# Patient Record
Sex: Female | Born: 1975 | Race: White | Hispanic: No | Smoking: Former smoker
Health system: Southern US, Community
[De-identification: ages and names within clinical notes are randomized; demographics above are authoritative.]

## PROBLEM LIST (undated history)

## (undated) DIAGNOSIS — E611 Iron deficiency: Secondary | ICD-10-CM

## (undated) DIAGNOSIS — N159 Renal tubulo-interstitial disease, unspecified: Secondary | ICD-10-CM

## (undated) DIAGNOSIS — Z8619 Personal history of other infectious and parasitic diseases: Secondary | ICD-10-CM

## (undated) DIAGNOSIS — B379 Candidiasis, unspecified: Secondary | ICD-10-CM

## (undated) DIAGNOSIS — IMO0002 Reserved for concepts with insufficient information to code with codable children: Secondary | ICD-10-CM

## (undated) HISTORY — DX: Renal tubulo-interstitial disease, unspecified: N15.9

## (undated) HISTORY — PX: TUBAL LIGATION: SHX77

## (undated) HISTORY — DX: Iron deficiency: E61.1

## (undated) HISTORY — PX: TOOTH EXTRACTION: SUR596

## (undated) HISTORY — PX: ENDOMETRIAL ABLATION: SHX621

## (undated) HISTORY — DX: Personal history of other infectious and parasitic diseases: Z86.19

## (undated) HISTORY — DX: Candidiasis, unspecified: B37.9

## (undated) HISTORY — PX: DILATION AND CURETTAGE OF UTERUS: SHX78

## (undated) HISTORY — DX: Reserved for concepts with insufficient information to code with codable children: IMO0002

---

## 2010-07-20 DIAGNOSIS — IMO0002 Reserved for concepts with insufficient information to code with codable children: Secondary | ICD-10-CM | POA: Insufficient documentation

## 2010-07-20 DIAGNOSIS — R87619 Unspecified abnormal cytological findings in specimens from cervix uteri: Secondary | ICD-10-CM

## 2010-07-20 HISTORY — DX: Unspecified abnormal cytological findings in specimens from cervix uteri: R87.619

## 2010-07-20 HISTORY — DX: Reserved for concepts with insufficient information to code with codable children: IMO0002

## 2011-06-29 ENCOUNTER — Telehealth: Payer: Self-pay | Admitting: Obstetrics and Gynecology

## 2011-06-30 NOTE — Telephone Encounter (Signed)
Spoke with pt

## 2011-07-06 ENCOUNTER — Ambulatory Visit (INDEPENDENT_AMBULATORY_CARE_PROVIDER_SITE_OTHER): Payer: BC Managed Care – HMO | Admitting: Obstetrics and Gynecology

## 2011-07-06 ENCOUNTER — Encounter: Payer: Self-pay | Admitting: Obstetrics and Gynecology

## 2011-07-06 DIAGNOSIS — Z8619 Personal history of other infectious and parasitic diseases: Secondary | ICD-10-CM

## 2011-07-06 DIAGNOSIS — E611 Iron deficiency: Secondary | ICD-10-CM

## 2011-07-06 DIAGNOSIS — B49 Unspecified mycosis: Secondary | ICD-10-CM

## 2011-07-06 DIAGNOSIS — N926 Irregular menstruation, unspecified: Secondary | ICD-10-CM

## 2011-07-06 DIAGNOSIS — R6889 Other general symptoms and signs: Secondary | ICD-10-CM

## 2011-07-06 DIAGNOSIS — B379 Candidiasis, unspecified: Secondary | ICD-10-CM

## 2011-07-06 DIAGNOSIS — D509 Iron deficiency anemia, unspecified: Secondary | ICD-10-CM

## 2011-07-06 DIAGNOSIS — IMO0002 Reserved for concepts with insufficient information to code with codable children: Secondary | ICD-10-CM

## 2011-07-06 DIAGNOSIS — N159 Renal tubulo-interstitial disease, unspecified: Secondary | ICD-10-CM

## 2011-07-06 LAB — POCT URINE PREGNANCY: Preg Test, Ur: NEGATIVE

## 2011-07-06 NOTE — Patient Instructions (Signed)
Obtain from patient release of information to obtain 2012 labs from Dr. Orvan Falconer in [place], [place]  Give patient a brochure on Nexplanon & schedule on same day as ultrasound

## 2011-07-06 NOTE — Progress Notes (Signed)
Patient with history of oligomenorrhea without birth control pills presents with bleeding 3 week/month since December. Changes pad with tampon 3 times a day along with cramps 4/10 but relief to 2/10 with Ibuprofen 600mg . Diagnosed with Lyme  Disease in October 2012. Denies missed contraceptive pills, vomiting or diarrhea.  Denies change in sexual partners, dyspareunia, pelvic pain (except with menses), uti sx or change in BM.  Patient's mother and aunts have a h/o thyroid dz but has been checked for thyroid issues with normal finding in February  2013  (Dr. Wess Botts in [place]).   O: Abdomen: soft, non-tender     Pelvic: EGBUS, vagina-white d/c, cervix-without tenderness or lesions, uterus-not tender, normal size, shape and consistency   UPT-negative Wet Prep: pH= 5,   - whiff,  -clue, trich,yeast   A: Metrorrhagia  P: pelvic ultrasound r/o pelvic masses     Counseled patient about smoking and BCP at age    35-VTE risks     Given list & reviewed contraceptive -patient wants the Nexplanon-schedule

## 2011-07-06 NOTE — Progress Notes (Signed)
Contraception: yes Fibroids: no Hormone Therapy: no New Medications: no Menopausal Symptoms: no Vag. Discharge: no Abdominal Pain: yes Increased Stress: yes

## 2011-07-20 HISTORY — PX: DIAGNOSTIC LAPAROSCOPY: SUR761

## 2011-07-22 ENCOUNTER — Ambulatory Visit (INDEPENDENT_AMBULATORY_CARE_PROVIDER_SITE_OTHER): Payer: BC Managed Care – HMO

## 2011-07-22 ENCOUNTER — Encounter: Payer: Self-pay | Admitting: Obstetrics and Gynecology

## 2011-07-22 ENCOUNTER — Other Ambulatory Visit: Payer: Self-pay | Admitting: Obstetrics and Gynecology

## 2011-07-22 ENCOUNTER — Ambulatory Visit (INDEPENDENT_AMBULATORY_CARE_PROVIDER_SITE_OTHER): Payer: BC Managed Care – HMO | Admitting: Obstetrics and Gynecology

## 2011-07-22 VITALS — BP 102/70 | Wt 125.0 lb

## 2011-07-22 DIAGNOSIS — N9489 Other specified conditions associated with female genital organs and menstrual cycle: Secondary | ICD-10-CM

## 2011-07-22 DIAGNOSIS — N926 Irregular menstruation, unspecified: Secondary | ICD-10-CM

## 2011-07-22 DIAGNOSIS — N920 Excessive and frequent menstruation with regular cycle: Secondary | ICD-10-CM

## 2011-07-22 NOTE — Patient Instructions (Signed)
Give

## 2011-07-22 NOTE — Progress Notes (Signed)
Patient seen for irregular vaginal bleeding by Dr. Su Hilt on 07/06/11 returns for ultrasound follow up.   O:  U/S: uterus 9.12 x 4.58 x 4.47 cm, endometrium 0.805 cm with focal hyperechocic area within, suspicious for endometrial polyp/mass, color flow enhancement present. Left ovary with predominantly simple ovarian cyst 3.6 x 3.0 x 3.0 cm, edges of cyst are irregular, normal flow to left ovary that measures 4.27 x 3.32 x 2.76 cm, right ovary 2.90 x 2.11 x 1.49 cm  A:Menorrhagia    Endometrial mass    Left ovarian cyst    Family History Ovarian Cancer  P: Repeat ultrasound in 6 weeks      After reviewing with patient SHG, Hysteroscopy D & C     and medical/surgical management options for          menorrhagia, she desires to have HSC with D&C followed by endometrial ablation and BTL  To inform Dr. Su Hilt of patient's request and forward information to A. Pridgen for insurance exploration  Patient want procedures as soon as possible.   Rosmery Duggin J. Lowell Guitar, PA-C

## 2011-08-01 ENCOUNTER — Other Ambulatory Visit: Payer: Self-pay | Admitting: Obstetrics and Gynecology

## 2011-08-04 ENCOUNTER — Telehealth: Payer: Self-pay | Admitting: Obstetrics and Gynecology

## 2011-08-04 ENCOUNTER — Other Ambulatory Visit: Payer: BC Managed Care – HMO

## 2011-08-04 ENCOUNTER — Encounter: Payer: BC Managed Care – HMO | Admitting: Obstetrics and Gynecology

## 2011-08-04 NOTE — Progress Notes (Signed)
Addended by: Osborn Coho on: 08/04/2011 08:09 AM   Modules accepted: Orders

## 2011-08-04 NOTE — Telephone Encounter (Signed)
Jackie/epic °

## 2011-08-05 ENCOUNTER — Encounter (HOSPITAL_COMMUNITY): Payer: Self-pay | Admitting: Pharmacist

## 2011-08-08 ENCOUNTER — Telehealth: Payer: Self-pay | Admitting: Obstetrics and Gynecology

## 2011-08-08 NOTE — Telephone Encounter (Signed)
Lap BTL, Possible Left Ovarian Cystectomy; Hysteroscopy D&C; Endometrial Ablation, Scheduled for 08/18/11 @ 11:45 with AR/EP.  Plan pays 80/20 after a $750 deductible. Pre-op due $152.20 Adrianne Pridgen

## 2011-08-09 ENCOUNTER — Other Ambulatory Visit: Payer: Self-pay | Admitting: Obstetrics and Gynecology

## 2011-08-09 ENCOUNTER — Encounter: Payer: Self-pay | Admitting: Obstetrics and Gynecology

## 2011-08-09 ENCOUNTER — Encounter (HOSPITAL_COMMUNITY)
Admission: RE | Admit: 2011-08-09 | Discharge: 2011-08-09 | Disposition: A | Discharge status: Home / Self Care | Payer: BC Managed Care – PPO | Source: Ambulatory Visit | Attending: Obstetrics and Gynecology | Admitting: Obstetrics and Gynecology

## 2011-08-09 ENCOUNTER — Encounter (HOSPITAL_COMMUNITY): Payer: Self-pay

## 2011-08-09 ENCOUNTER — Ambulatory Visit (INDEPENDENT_AMBULATORY_CARE_PROVIDER_SITE_OTHER): Payer: BC Managed Care – HMO | Admitting: Obstetrics and Gynecology

## 2011-08-09 VITALS — BP 100/70 | HR 72 | Temp 98.5°F | Resp 16 | Ht 64.0 in | Wt 126.0 lb

## 2011-08-09 DIAGNOSIS — N926 Irregular menstruation, unspecified: Secondary | ICD-10-CM

## 2011-08-09 DIAGNOSIS — N9489 Other specified conditions associated with female genital organs and menstrual cycle: Secondary | ICD-10-CM

## 2011-08-09 LAB — CBC
MCHC: 31.3 g/dL (ref 30.0–36.0)
Platelets: 295 10*3/uL (ref 150–400)
RDW: 13.7 % (ref 11.5–15.5)
WBC: 6.2 10*3/uL (ref 4.0–10.5)

## 2011-08-09 LAB — SURGICAL PCR SCREEN
MRSA, PCR: NEGATIVE
Staphylococcus aureus: NEGATIVE

## 2011-08-09 NOTE — Patient Instructions (Addendum)
YOUR PROCEDURE IS SCHEDULED ON:08/18/11  ENTER THROUGH THE MAIN ENTRANCE OF [facility] AT:10:15 am  USE DESK PHONE AND DIAL [id number] TO INFORM us OF YOUR ARRIVAL  CALL [phone] IF YOU HAVE ANY QUESTIONS OR PROBLEMS PRIOR TO YOUR ARRIVAL.  REMEMBER: DO NOT EAT AFTER MIDNIGHT : Wed  SPECIAL INSTRUCTIONS:clear liquids until 0730 am on Thursday  YOU MAY BRUSH YOUR TEETH THE MORNING OF SURGERY   TAKE THESE MEDICINES THE DAY OF SURGERY WITH SIP OF WATER:none   DO NOT WEAR JEWELRY, EYE MAKEUP, LIPSTICK OR DARK FINGERNAIL POLISH DO NOT WEAR LOTIONS  DO NOT SHAVE FOR 48 HOURS PRIOR TO SURGERY  YOU WILL NOT BE ALLOWED TO DRIVE YOURSELF HOME.  NAME OF DRIVER: Glenetta Borg

## 2011-08-09 NOTE — H&P (Signed)
Chelsea Byrd is a 36 y.o. female [id number] who presents for hysteroscopy, D & C with resection of an endometrial mass, endometrial ablation, tubal sterilization and possible ovarian cystectomy because of irregular and heavy vaginal bleeding, left ovarian cyst and desire for sterilization. Since December of 2012, patient has had vaginal bleeding 3 weeks out of the month during which time she changes her tampon 3 times a day. She will experience cramping that is rated as a 4/10 on a 10 point pain scale but relieved with Ibuprofen 600 mg. Denies any dyspareunia, vaginitis symptoms, urinary tract symptoms or changes in bowel movements. She admits to being concerned because her maternal aunt was diagnosed with ovarian cancer when she was her age.  Pelvic ultrasound (07/22/11) uterus 9.12 x 4.58 x 4.47 cm with a focal hyperechoic area within the endometrium with color flow enhancement present. (suspicious for a polyp/mass). Right ovary 2.90 x 2.11 x 1.49 cm and left ovary 4.27 x 3.32 x 2.76 cm with a cyst 3.6 x 3.0 x 3.0 cm that is predominantly simple with irregular edges. Normal color flow to left ovary.  A review of both medical and surgical management options were given to the patient, to include observation, however she wishes to proceed with surgical management of her bleeding with hysteroscopy, D&C,. and endometrial ablation.  Due to the findings of an ovarian cyst and patient's anxiety regarding her aunt's ovarian cancer diagnosis, she wants to also have her ovarian cyst removed (if it is still present) or her left ovary removed if it appears to be diseased. Lastly  the patient wants to pursue permanent sterilization as she has completed her desire for childbearing.   Past Medical History   OB History: G4P0021 SVD 2005   GYN History: menarche LMP 08/03/11 Contracepton no method The patient denies history of sexually transmitted disease. Denies history of abnormal PAP smear Last PAP smear 07/2010    Medical History: anemia, Lyme Disease (12/2010), depression, anxiety   Surgical History: Dilatation and Curettage  Denies problems with anesthesia or history of blood transfusions   Family History: Cancer (including ovarian), diabetes, Alzheimers, thyroid disease   Social History: Engaged and works at [employer]   Habits: quit smoking 07/30/11 (previous 1/2 PPD); alcohol once a month; denies illicit drugs    Outpatient Encounter Prescriptions as of 08/09/2011    Medication  Sig  Dispense  Refill    .  calcium carbonate (OS-CAL) 600 MG TABS  Take 2,400 mg by mouth daily.      .  Cyanocobalamin (VITAMIN B-12 IJ)  Inject as directed every 30 (thirty) days.      .  cyanocobalamin 500 MCG tablet  Place 2,000 mcg under the tongue daily.      .  ibuprofen (ADVIL,MOTRIN) 400 MG tablet  Take 400 mg by mouth every 6 (six) hours as needed.      .  Multiple Vitamin (MULITIVITAMIN WITH MINERALS) TABS  Take 1 tablet by mouth daily. Uses [employer] brands.       No Known Allergies Denies sensitivity to latex, peanuts, soy, adhesives or shellfish   ROS:: + glasses/contact lenses, occasional constipation alternating with diarrhea, denies headache, vision changes, dysphagia, tinnitus, dizziness, chest pain, shortness of breath, nausea, vomiting, dysuria, hematuria, pelvic pain, swelling of joints,easy bruising, myalgias, arthralgias, skin rashes and except as is mentioned in the history of present illness, patient's review of systems is otherwise negative  Physical Exam   BP 100/70  Pulse 72  Temp(Src) 98.5 F (36.9 C) (  Oral)  Resp 16  Ht 5' 4" (1.626 m)  Wt 126 lb (57.153 kg)  BMI 21.63 kg/m2  LMP 08/03/2011  Neck: supple without masses, no thyromegaly  Lungs: clear to auscultation  Heart: regular rate and rhythm  Abdomen: soft, non-tender, no organomegaly  Pelvic : EGBUS-wnl, vagina-normal rugae, cervix-no lesions, uterus-no tenderness, normal size,, adnexae-no tenderness or palpable masses   Extremities: no clubbing, cyanosis or edema     Assesment :Irregular Bleeding  Endometrial Mass  Left Ovarian Cyst  Desire for Sterilization    Disposition: A discussion was held with patient regarding the indication for her procedure(s) along with their risks, which include but are not limited to: reaction to anesthesia, damage to adjacent organs, infection, excessive bleeding, oophorectomy and sterilization failure rate of 1/500. The patient verbalized understanding of these risks and has consented to proceed with a hysteroscopy, dilatation & curettage, endometrial ablation, tubal sterilization and possible ovarian cystectomy and possible oophorectomy at [facility], 08/18/11.    CSN# [id number]  Shenna Brissette J. Nevayah Faust, PA-C for Dr. Angela Y. Roberts           

## 2011-08-09 NOTE — Progress Notes (Signed)
Chelsea Byrd is a 36 y.o. female [phone] who presents for hysteroscopy, D & C with resection of an endometrial mass, endometrial ablation, tubal sterilization and possible ovarian cystectomy because of irregular and heavy vaginal bleeding, left ovarian cyst and desire for sterilization. Since December of 2012, patient has had vaginal bleeding 3 weeks out of the month during which time she changes her tampon 3 times a day.  She will experience cramping that is rated as a 4/10 on a 10 point pain scale but relieved with Ibuprofen 600 mg.  Denies any dyspareunia, vaginitis symptoms, urinary tract symptoms or changes in bowel movements.  She admits to being concerned because her maternal aunt was diagnosed with ovarian cancer when she was her age.  Pelvic ultrasound (07/22/11) uterus 9.12 x 4.58 x 4.47 cm with a focal hyperechoic area within the endometrium with color flow enhancement present. (suspicious for a polyp/mass). Right ovary 2.90 x 2.11 x 1.49 cm and left ovary 4.27 x 3.32 x 2.76 cm with a cyst 3.6 x 3.0 x 3.0 cm that is predominantly simple with irregular edges. Normal color flow to left ovary.  A review of both medical and surgical management options were given to the patient, to include observation, however she wishes to proceed with surgical management of her bleeding with hysteroscopy, D&C,. and endometrial ablation. Due to the findings of an ovarian cyst and patient's anxiety regarding her aunt's ovarian cancer diagnosis, she wants to also have her ovarian cyst removed (if it is still present) or her left ovary removed if it appears to be diseased. Lastly the patient wants to pursue permanent sterilization as she has completed her desire for childbearing.  Past Medical History  OB History: [id number] SVD 2005  GYN History: menarche    LMP 08/03/11   Contracepton no method   The patient denies history of sexually transmitted disease.  Denies history of abnormal PAP smear  Last PAP smear  07/2010  Medical History: anemia, Lyme Disease (12/2010), depression, anxiety  Surgical History:  Dilatation and Curettage  Denies problems with anesthesia or history of blood transfusions  Family History: Cancer (including ovarian), diabetes, Alzheimers, thyroid disease  Social History:   Engaged and works at [employer]   Habits: quit smoking 07/30/11 (previously 1/2 ppd); alcohol 1 drink/month; denies illicit drug use  Outpatient Encounter Prescriptions as of 08/09/2011  Medication Sig Dispense Refill  . calcium carbonate (OS-CAL) 600 MG TABS Take 2,400 mg by mouth daily.      . Cyanocobalamin (VITAMIN B-12 IJ) Inject as directed every 30 (thirty) days.      . cyanocobalamin 500 MCG tablet Place 2,000 mcg under the tongue daily.       Marland Kitchen ibuprofen (ADVIL,MOTRIN) 400 MG tablet Take 400 mg by mouth every 6 (six) hours as needed.      . Multiple Vitamin (MULITIVITAMIN WITH MINERALS) TABS Take 1 tablet by mouth daily. Uses [employer] brands.        No Known Allergies Denies sensitivity to latex, peanuts, soy, adhesives or shellfish  ROS:ROS: + glasses/contact lenses, occasional constipation alternating with diarrhea,  denies headache, vision changes, dysphagia, tinnitus, dizziness,  chest pain, shortness of breath, nausea, vomiting, dysuria, hematuria, pelvic pain, swelling of joints,easy bruising,  myalgias, arthralgias, skin rashes and except as is mentioned in the history of present illness, patient's review of systems is otherwise negative  Physical Exam    BP 100/70  Pulse 72  Temp(Src) 98.5 F (36.9 C) (Oral)  Resp 16  Ht 5\' 4"  (  1.626 m)  Wt 126 lb (57.153 kg)  BMI 21.63 kg/m2  LMP 08/03/2011  Neck: supple without masses, no thyromegaly Lungs: clear to auscultation Heart: regular rate and rhythm Abdomen: soft, non-tender, no organomegaly Pelvic : EGBUS-wnl, vagina-normal rugae, cervix-no lesions, uterus-no tenderness, normal size,, adnexae-no tenderness or palpable  masses Extremities:  no clubbing, cyanosis or edema   Assesment:Irregular Bleeding                    Endometrial Mass                    Left Ovarian Cyst                    Desire for Sterilization   Disposition:  A discussion was held with patient regarding the indication for her procedure(s) along with their risks, which include but are not limited to: reaction to anesthesia, damage to adjacent organs, infection,  excessive bleeding, oophorectomy and sterilization failure rate of 1/500. The patient verbalized understanding of these risks and has consented to proceed with a hysteroscopy, dilatation & curettage, endometrial ablation, tubal sterilization and possible ovarian cystectomy and possible oophorectomy at [facility], 08/18/11.   CSN# [id number]   Thaila Bottoms J. Lowell Guitar, PA-C  for Dr. Woodroe Mode. Su Hilt

## 2011-08-11 ENCOUNTER — Telehealth: Payer: Self-pay

## 2011-08-11 NOTE — Telephone Encounter (Signed)
Spoke to pt re: message she left. She has since been in to see EP for the problem and is scheduled for surgery on 08/18/2011. Melody Comas A

## 2011-08-17 MED ORDER — DEXTROSE 5 % IV SOLN
1.0000 g | INTRAVENOUS | Status: AC
  Administered 2011-08-18: 1 g via INTRAVENOUS
  Filled 2011-08-17: qty 1

## 2011-08-18 ENCOUNTER — Encounter (HOSPITAL_COMMUNITY): Payer: Self-pay | Admitting: Anesthesiology

## 2011-08-18 ENCOUNTER — Ambulatory Visit (HOSPITAL_COMMUNITY)
Admission: RE | Admit: 2011-08-18 | Discharge: 2011-08-18 | Disposition: A | Discharge status: Home / Self Care | Payer: BC Managed Care – PPO | Source: Ambulatory Visit | Attending: Obstetrics and Gynecology | Admitting: Obstetrics and Gynecology

## 2011-08-18 ENCOUNTER — Encounter (HOSPITAL_COMMUNITY)
Admission: RE | Disposition: A | Discharge status: Home / Self Care | Payer: Self-pay | Source: Ambulatory Visit | Attending: Obstetrics and Gynecology

## 2011-08-18 ENCOUNTER — Ambulatory Visit (HOSPITAL_COMMUNITY): Payer: BC Managed Care – PPO | Admitting: Anesthesiology

## 2011-08-18 DIAGNOSIS — N920 Excessive and frequent menstruation with regular cycle: Secondary | ICD-10-CM | POA: Insufficient documentation

## 2011-08-18 DIAGNOSIS — N83209 Unspecified ovarian cyst, unspecified side: Secondary | ICD-10-CM

## 2011-08-18 DIAGNOSIS — N840 Polyp of corpus uteri: Secondary | ICD-10-CM

## 2011-08-18 DIAGNOSIS — Z302 Encounter for sterilization: Secondary | ICD-10-CM | POA: Insufficient documentation

## 2011-08-18 DIAGNOSIS — Z01812 Encounter for preprocedural laboratory examination: Secondary | ICD-10-CM | POA: Insufficient documentation

## 2011-08-18 DIAGNOSIS — N949 Unspecified condition associated with female genital organs and menstrual cycle: Secondary | ICD-10-CM | POA: Insufficient documentation

## 2011-08-18 DIAGNOSIS — N938 Other specified abnormal uterine and vaginal bleeding: Secondary | ICD-10-CM | POA: Insufficient documentation

## 2011-08-18 DIAGNOSIS — N859 Noninflammatory disorder of uterus, unspecified: Secondary | ICD-10-CM

## 2011-08-18 DIAGNOSIS — Z01818 Encounter for other preprocedural examination: Secondary | ICD-10-CM | POA: Insufficient documentation

## 2011-08-18 HISTORY — PX: OVARIAN CYST REMOVAL: SHX89

## 2011-08-18 HISTORY — PX: LAPAROSCOPIC TUBAL LIGATION: SHX1937

## 2011-08-18 SURGERY — DILATATION & CURETTAGE/HYSTEROSCOPY WITH NOVASURE ABLATION
Anesthesia: General | Site: Vagina | Wound class: Clean Contaminated

## 2011-08-18 MED ORDER — KETOROLAC TROMETHAMINE 30 MG/ML IJ SOLN
INTRAMUSCULAR | Status: AC
  Filled 2011-08-18: qty 1

## 2011-08-18 MED ORDER — LACTATED RINGERS IR SOLN
Status: DC | PRN
  Administered 2011-08-18: 1

## 2011-08-18 MED ORDER — KETOROLAC TROMETHAMINE 30 MG/ML IJ SOLN: 15.0000 mg | Freq: Once | INTRAMUSCULAR | Status: DC | PRN

## 2011-08-18 MED ORDER — PROPOFOL 10 MG/ML IV EMUL
INTRAVENOUS | Status: DC | PRN
  Administered 2011-08-18: 150 mg via INTRAVENOUS

## 2011-08-18 MED ORDER — PROMETHAZINE HCL 25 MG/ML IJ SOLN: 6.2500 mg | INTRAMUSCULAR | Status: DC | PRN

## 2011-08-18 MED ORDER — PROPOFOL 10 MG/ML IV EMUL
INTRAVENOUS | Status: AC
  Filled 2011-08-18: qty 20

## 2011-08-18 MED ORDER — ONDANSETRON HCL 4 MG/2ML IJ SOLN
INTRAMUSCULAR | Status: AC
  Filled 2011-08-18: qty 2

## 2011-08-18 MED ORDER — MIDAZOLAM HCL 5 MG/5ML IJ SOLN
INTRAMUSCULAR | Status: DC | PRN
  Administered 2011-08-18: 2 mg via INTRAVENOUS

## 2011-08-18 MED ORDER — OXYCODONE-ACETAMINOPHEN 5-325 MG PO TABS: 1.0000 | ORAL_TABLET | Freq: Four times a day (QID) | ORAL | Status: AC | PRN

## 2011-08-18 MED ORDER — BUPIVACAINE HCL (PF) 0.25 % IJ SOLN
INTRAMUSCULAR | Status: DC | PRN
  Administered 2011-08-18: 16 mL

## 2011-08-18 MED ORDER — LIDOCAINE HCL (CARDIAC) 20 MG/ML IV SOLN
INTRAVENOUS | Status: DC | PRN
  Administered 2011-08-18: 30 mg via INTRAVENOUS

## 2011-08-18 MED ORDER — LIDOCAINE HCL 1 % IJ SOLN
INTRAMUSCULAR | Status: DC | PRN
  Administered 2011-08-18: 10 mL

## 2011-08-18 MED ORDER — BUPIVACAINE-EPINEPHRINE PF 0.25-1:200000 % IJ SOLN
INTRAMUSCULAR | Status: AC
  Filled 2011-08-18: qty 30

## 2011-08-18 MED ORDER — MEPERIDINE HCL 25 MG/ML IJ SOLN: 6.2500 mg | INTRAMUSCULAR | Status: DC | PRN

## 2011-08-18 MED ORDER — NEOSTIGMINE METHYLSULFATE 1 MG/ML IJ SOLN
INTRAMUSCULAR | Status: AC
  Filled 2011-08-18: qty 10

## 2011-08-18 MED ORDER — FENTANYL CITRATE 0.05 MG/ML IJ SOLN
INTRAMUSCULAR | Status: AC
  Filled 2011-08-18: qty 2

## 2011-08-18 MED ORDER — FENTANYL CITRATE 0.05 MG/ML IJ SOLN
INTRAMUSCULAR | Status: AC
  Filled 2011-08-18: qty 5

## 2011-08-18 MED ORDER — NEOSTIGMINE METHYLSULFATE 1 MG/ML IJ SOLN
INTRAMUSCULAR | Status: DC | PRN
  Administered 2011-08-18: 2 mg via INTRAVENOUS

## 2011-08-18 MED ORDER — OXYCODONE-ACETAMINOPHEN 5-325 MG PO TABS
1.0000 | ORAL_TABLET | Freq: Once | ORAL | Status: AC
  Administered 2011-08-18: 1 via ORAL

## 2011-08-18 MED ORDER — ROCURONIUM BROMIDE 100 MG/10ML IV SOLN
INTRAVENOUS | Status: DC | PRN
  Administered 2011-08-18: 40 mg via INTRAVENOUS

## 2011-08-18 MED ORDER — GLYCOPYRROLATE 0.2 MG/ML IJ SOLN
INTRAMUSCULAR | Status: DC | PRN
  Administered 2011-08-18: 0.4 mg via INTRAVENOUS

## 2011-08-18 MED ORDER — KETOROLAC TROMETHAMINE 30 MG/ML IJ SOLN
INTRAMUSCULAR | Status: DC | PRN
  Administered 2011-08-18: 30 mg via INTRAVENOUS

## 2011-08-18 MED ORDER — FENTANYL CITRATE 0.05 MG/ML IJ SOLN
INTRAMUSCULAR | Status: AC
  Administered 2011-08-18: 50 ug via INTRAVENOUS
  Filled 2011-08-18: qty 2

## 2011-08-18 MED ORDER — OXYCODONE-ACETAMINOPHEN 5-325 MG PO TABS
ORAL_TABLET | ORAL | Status: AC
  Filled 2011-08-18: qty 1

## 2011-08-18 MED ORDER — FENTANYL CITRATE 0.05 MG/ML IJ SOLN
25.0000 ug | INTRAMUSCULAR | Status: DC | PRN
  Administered 2011-08-18: 50 ug via INTRAVENOUS
  Administered 2011-08-18: 25 ug via INTRAVENOUS
  Administered 2011-08-18 (×2): 50 ug via INTRAVENOUS
  Administered 2011-08-18: 25 ug via INTRAVENOUS

## 2011-08-18 MED ORDER — ROCURONIUM BROMIDE 50 MG/5ML IV SOLN
INTRAVENOUS | Status: AC
  Filled 2011-08-18: qty 1

## 2011-08-18 MED ORDER — DEXAMETHASONE SODIUM PHOSPHATE 10 MG/ML IJ SOLN
INTRAMUSCULAR | Status: AC
  Filled 2011-08-18: qty 1

## 2011-08-18 MED ORDER — FENTANYL CITRATE 0.05 MG/ML IJ SOLN
INTRAMUSCULAR | Status: DC | PRN
  Administered 2011-08-18 (×4): 50 ug via INTRAVENOUS
  Administered 2011-08-18: 100 ug via INTRAVENOUS
  Administered 2011-08-18: 50 ug via INTRAVENOUS

## 2011-08-18 MED ORDER — DEXAMETHASONE SODIUM PHOSPHATE 4 MG/ML IJ SOLN
INTRAMUSCULAR | Status: DC | PRN
  Administered 2011-08-18: 6 mg via INTRAVENOUS

## 2011-08-18 MED ORDER — IBUPROFEN 600 MG PO TABS: 600.0000 mg | ORAL_TABLET | Freq: Four times a day (QID) | ORAL | Status: AC | PRN

## 2011-08-18 MED ORDER — LACTATED RINGERS IV SOLN
INTRAVENOUS | Status: DC
  Administered 2011-08-18 (×3): via INTRAVENOUS

## 2011-08-18 MED ORDER — MIDAZOLAM HCL 2 MG/2ML IJ SOLN
INTRAMUSCULAR | Status: AC
  Filled 2011-08-18: qty 2

## 2011-08-18 MED ORDER — LIDOCAINE HCL (CARDIAC) 20 MG/ML IV SOLN
INTRAVENOUS | Status: AC
  Filled 2011-08-18: qty 5

## 2011-08-18 MED ORDER — GLYCOPYRROLATE 0.2 MG/ML IJ SOLN
INTRAMUSCULAR | Status: AC
  Filled 2011-08-18: qty 2

## 2011-08-18 SURGICAL SUPPLY — 26 items
ABLATOR ENDOMETRIAL BIPOLAR (ABLATOR) ×4 IMPLANT
CABLE HIGH FREQUENCY MONO STRZ (ELECTRODE) ×4 IMPLANT
CATH ROBINSON RED A/P 16FR (CATHETERS) ×4 IMPLANT
CHLORAPREP W/TINT 26ML (MISCELLANEOUS) ×4 IMPLANT
CLOTH BEACON ORANGE TIMEOUT ST (SAFETY) ×4 IMPLANT
CONTAINER PREFILL 10% NBF 60ML (FORM) ×8 IMPLANT
DERMABOND ADVANCED (GAUZE/BANDAGES/DRESSINGS) ×1
DERMABOND ADVANCED .7 DNX12 (GAUZE/BANDAGES/DRESSINGS) ×3 IMPLANT
DRESSING TELFA 8X3 (GAUZE/BANDAGES/DRESSINGS) ×4 IMPLANT
ELECT REM PT RETURN 9FT ADLT (ELECTROSURGICAL) ×4
ELECTRODE REM PT RTRN 9FT ADLT (ELECTROSURGICAL) ×3 IMPLANT
GLOVE BIO SURGEON STRL SZ7.5 (GLOVE) ×8 IMPLANT
GLOVE BIOGEL PI IND STRL 7.5 (GLOVE) ×3 IMPLANT
GLOVE BIOGEL PI INDICATOR 7.5 (GLOVE) ×1
NEEDLE HYPO 25X1 1.5 SAFETY (NEEDLE) ×4 IMPLANT
NEEDLE INSUFFLATION 14GA 120MM (NEEDLE) ×4 IMPLANT
PACK LAPAROSCOPY BASIN (CUSTOM PROCEDURE TRAY) ×4 IMPLANT
PAD PREP 24X48 CUFFED NSTRL (MISCELLANEOUS) ×4 IMPLANT
SLEEVE Z-THREAD 5X100MM (TROCAR) ×4 IMPLANT
SUT MON AB 4-0 PS1 27 (SUTURE) ×4 IMPLANT
SUT VICRYL 0 UR6 27IN ABS (SUTURE) ×4 IMPLANT
SYR 50ML LL SCALE MARK (SYRINGE) ×4 IMPLANT
TROCAR Z-THREAD FIOS 11X100 BL (TROCAR) ×4 IMPLANT
TROCAR Z-THREAD FIOS 5X100MM (TROCAR) ×4 IMPLANT
TUBING SUCTION BULK 100 FT (MISCELLANEOUS) ×4 IMPLANT
WARMER LAPAROSCOPE (MISCELLANEOUS) ×4 IMPLANT

## 2011-08-18 NOTE — Transfer of Care (Signed)
Immediate Anesthesia Transfer of Care Note  Patient: Quinteria Marsolek  Procedure(s) Performed: Procedure(s) (LRB): DILATATION & CURETTAGE/HYSTEROSCOPY WITH NOVASURE ABLATION (N/A) LAPAROSCOPIC TUBAL LIGATION (Bilateral) OVARIAN CYSTECTOMY (Left)  Patient Location: PACU  Anesthesia Type: General  Level of Consciousness: awake, alert  and oriented  Airway & Oxygen Therapy: Patient Spontanous Breathing and Patient connected to nasal cannula oxygen  Post-op Assessment: Report given to PACU RN and Post -op Vital signs reviewed and stable  Post vital signs: Reviewed  Complications: No apparent anesthesia complications

## 2011-08-18 NOTE — Discharge Instructions (Signed)

## 2011-08-18 NOTE — Interval H&P Note (Signed)
History and Physical Interval Note:  08/18/2011 12:08 PM  Chelsea Byrd  has presented today for surgery, with the diagnosis of Menorrhagia, Endometrial Mass, (L) Ovarian Cyst  The various methods of treatment have been discussed with the patient and family. After consideration of risks, benefits and other options for treatment, the patient has consented to  Procedure(s) (LRB): DILATATION & CURETTAGE/HYSTEROSCOPY WITH NOVASURE ABLATION (N/A) LAPAROSCOPIC TUBAL LIGATION (Bilateral) as a surgical intervention .  The patients' history has been reviewed, patient examined, no change in status, stable for surgery.  I have reviewed the patients' chart and labs.  Questions were answered to the patient's satisfaction.  R/B/A reviewed.  Pt also requesting to repair the "scar" at her belly button where she had a piercing.   Purcell Nails

## 2011-08-18 NOTE — H&P (View-Only) (Signed)
Chelsea Byrd is a 36 y.o. female [phone] who presents for hysteroscopy, D & C with resection of an endometrial mass, endometrial ablation, tubal sterilization and possible ovarian cystectomy because of irregular and heavy vaginal bleeding, left ovarian cyst and desire for sterilization. Since December of 2012, patient has had vaginal bleeding 3 weeks out of the month during which time she changes her tampon 3 times a day. She will experience cramping that is rated as a 4/10 on a 10 point pain scale but relieved with Ibuprofen 600 mg. Denies any dyspareunia, vaginitis symptoms, urinary tract symptoms or changes in bowel movements. She admits to being concerned because her maternal aunt was diagnosed with ovarian cancer when she was her age.  Pelvic ultrasound (07/22/11) uterus 9.12 x 4.58 x 4.47 cm with a focal hyperechoic area within the endometrium with color flow enhancement present. (suspicious for a polyp/mass). Right ovary 2.90 x 2.11 x 1.49 cm and left ovary 4.27 x 3.32 x 2.76 cm with a cyst 3.6 x 3.0 x 3.0 cm that is predominantly simple with irregular edges. Normal color flow to left ovary.  A review of both medical and surgical management options were given to the patient, to include observation, however she wishes to proceed with surgical management of her bleeding with hysteroscopy, D&C,. and endometrial ablation.  Due to the findings of an ovarian cyst and patient's anxiety regarding her aunt's ovarian cancer diagnosis, she wants to also have her ovarian cyst removed (if it is still present) or her left ovary removed if it appears to be diseased. Lastly  the patient wants to pursue permanent sterilization as she has completed her desire for childbearing.   Past Medical History   OB History: [id number] SVD 2005   GYN History: menarche LMP 08/03/11 Contracepton no method The patient denies history of sexually transmitted disease. Denies history of abnormal PAP smear Last PAP smear 07/2010    Medical History: anemia, Lyme Disease (12/2010), depression, anxiety   Surgical History: Dilatation and Curettage  Denies problems with anesthesia or history of blood transfusions   Family History: Cancer (including ovarian), diabetes, Alzheimers, thyroid disease   Social History: Engaged and works at [employer]   Habits: quit smoking 07/30/11 (previous 1/2 PPD); alcohol once a month; denies illicit drugs    Outpatient Encounter Prescriptions as of 08/09/2011    Medication  Sig  Dispense  Refill    .  calcium carbonate (OS-CAL) 600 MG TABS  Take 2,400 mg by mouth daily.      .  Cyanocobalamin (VITAMIN B-12 IJ)  Inject as directed every 30 (thirty) days.      .  cyanocobalamin 500 MCG tablet  Place 2,000 mcg under the tongue daily.      Marland Kitchen  ibuprofen (ADVIL,MOTRIN) 400 MG tablet  Take 400 mg by mouth every 6 (six) hours as needed.      .  Multiple Vitamin (MULITIVITAMIN WITH MINERALS) TABS  Take 1 tablet by mouth daily. Uses [employer] brands.       No Known Allergies Denies sensitivity to latex, peanuts, soy, adhesives or shellfish   ROS:: + glasses/contact lenses, occasional constipation alternating with diarrhea, denies headache, vision changes, dysphagia, tinnitus, dizziness, chest pain, shortness of breath, nausea, vomiting, dysuria, hematuria, pelvic pain, swelling of joints,easy bruising, myalgias, arthralgias, skin rashes and except as is mentioned in the history of present illness, patient's review of systems is otherwise negative  Physical Exam   BP 100/70  Pulse 72  Temp(Src) 98.5 F (36.9 C) (  Oral)  Resp 16  Ht 5\' 4"  (1.626 m)  Wt 126 lb (57.153 kg)  BMI 21.63 kg/m2  LMP 08/03/2011  Neck: supple without masses, no thyromegaly  Lungs: clear to auscultation  Heart: regular rate and rhythm  Abdomen: soft, non-tender, no organomegaly  Pelvic : EGBUS-wnl, vagina-normal rugae, cervix-no lesions, uterus-no tenderness, normal size,, adnexae-no tenderness or palpable masses   Extremities: no clubbing, cyanosis or edema     Assesment :Irregular Bleeding  Endometrial Mass  Left Ovarian Cyst  Desire for Sterilization    Disposition: A discussion was held with patient regarding the indication for her procedure(s) along with their risks, which include but are not limited to: reaction to anesthesia, damage to adjacent organs, infection, excessive bleeding, oophorectomy and sterilization failure rate of 1/500. The patient verbalized understanding of these risks and has consented to proceed with a hysteroscopy, dilatation & curettage, endometrial ablation, tubal sterilization and possible ovarian cystectomy and possible oophorectomy at [facility], 08/18/11.    CSN# [id number]  Deretha Ertle J. Lowell Guitar, PA-C for Dr. Woodroe Mode. Su Hilt

## 2011-08-18 NOTE — Anesthesia Postprocedure Evaluation (Signed)
Anesthesia Post Note  Patient: Chelsea Byrd  Procedure(s) Performed: Procedure(s) (LRB): DILATATION & CURETTAGE/HYSTEROSCOPY WITH NOVASURE ABLATION (N/A) LAPAROSCOPIC TUBAL LIGATION (Bilateral) OVARIAN CYSTECTOMY (Left)  Anesthesia type: General  Patient location: PACU  Post pain: Pain level controlled  Post assessment: Post-op Vital signs reviewed  Last Vitals:  Filed Vitals:   08/18/11 1012  BP: 118/82  Pulse: 83  Temp: 36.6 C  Resp: 16    Post vital signs: Reviewed  Level of consciousness: sedated  Complications: No apparent anesthesia complicationsfj

## 2011-08-18 NOTE — Anesthesia Procedure Notes (Signed)
Procedure Name: Intubation Date/Time: 08/18/2011 1:01 PM Performed by: Suella Grove Pre-anesthesia Checklist: Suction available, Timeout performed, Emergency Drugs available, Patient identified and Patient being monitored Patient Re-evaluated:Patient Re-evaluated prior to inductionOxygen Delivery Method: Circle system utilized and Simple face mask Preoxygenation: Pre-oxygenation with 100% oxygen Intubation Type: IV induction Ventilation: Mask ventilation without difficulty Laryngoscope Size: Miller and 2 Grade View: Grade I Tube type: Oral Tube size: 7.0 mm Number of attempts: 1 Airway Equipment and Method: Stylet Placement Confirmation: ETT inserted through vocal cords under direct vision,  breath sounds checked- equal and bilateral and positive ETCO2 Secured at: 21 cm Tube secured with: Tape Dental Injury: Teeth and Oropharynx as per pre-operative assessment

## 2011-08-18 NOTE — Anesthesia Preprocedure Evaluation (Signed)
Anesthesia Evaluation  Patient identified by MRN, date of birth, ID band  Reviewed: Allergy & Precautions, H&P , NPO status , Patient's Chart, lab work & pertinent test results  Airway Mallampati: I TM Distance: >3 FB Neck ROM: full    Dental No notable dental hx. (+) Teeth Intact   Pulmonary neg pulmonary ROS,    Pulmonary exam normal       Cardiovascular negative cardio ROS      Neuro/Psych negative neurological ROS  negative psych ROS   GI/Hepatic negative GI ROS, Neg liver ROS,   Endo/Other  negative endocrine ROS  Renal/GU negative Renal ROS  negative genitourinary   Musculoskeletal negative musculoskeletal ROS (+)   Abdominal Normal abdominal exam  (+)   Peds negative pediatric ROS (+)  Hematology negative hematology ROS (+)   Anesthesia Other Findings   Reproductive/Obstetrics negative OB ROS                           Anesthesia Physical Anesthesia Plan  ASA: I  Anesthesia Plan: General   Post-op Pain Management:    Induction: Intravenous  Airway Management Planned: Oral ETT  Additional Equipment:   Intra-op Plan:   Post-operative Plan: Extubation in OR  Informed Consent: I have reviewed the patients History and Physical, chart, labs and discussed the procedure including the risks, benefits and alternatives for the proposed anesthesia with the patient or authorized representative who has indicated his/her understanding and acceptance.   Dental Advisory Given  Plan Discussed with: CRNA and Surgeon  Anesthesia Plan Comments:         Anesthesia Quick Evaluation  

## 2011-08-18 NOTE — Anesthesia Postprocedure Evaluation (Signed)
  Anesthesia Post-op Note  Patient: Chelsea Byrd  Procedure(s) Performed: Procedure(s) (LRB): DILATATION & CURETTAGE/HYSTEROSCOPY WITH NOVASURE ABLATION (N/A) LAPAROSCOPIC TUBAL LIGATION (Bilateral) OVARIAN CYSTECTOMY (Left)  Patient Location: PACU  Anesthesia Type: General  Level of Consciousness: awake, alert  and oriented  Airway and Oxygen Therapy: Patient Spontanous Breathing  Post-op Pain: mild  Post-op Assessment: Post-op Vital signs reviewed, Patient's Cardiovascular Status Stable, Respiratory Function Stable, Patent Airway, No signs of Nausea or vomiting and Pain level controlled  Post-op Vital Signs: Reviewed and stable  Complications: No apparent anesthesia complications

## 2011-08-19 ENCOUNTER — Telehealth: Payer: Self-pay

## 2011-08-19 ENCOUNTER — Encounter (HOSPITAL_COMMUNITY): Payer: Self-pay | Admitting: Obstetrics and Gynecology

## 2011-08-19 DIAGNOSIS — G8918 Other acute postprocedural pain: Secondary | ICD-10-CM

## 2011-08-19 MED ORDER — HYDROCODONE-ACETAMINOPHEN 5-500 MG PO TABS: 1.0000 | ORAL_TABLET | Freq: Four times a day (QID) | ORAL | Status: DC | PRN

## 2011-08-19 NOTE — Telephone Encounter (Signed)
Called pt back and LM for her to cb re: message she left about needing more pain meds to get her through the weekend.Melody Comas A

## 2011-08-19 NOTE — Telephone Encounter (Signed)
Jackie/epic °

## 2011-08-19 NOTE — Op Note (Addendum)
Preop Diagnosis: Menorrhagia, Endometrial Mass, (L) Ovarian Cyst   Postop Diagnosis: Menorrhagia, Endometrial Mass, (L) Ovarian Cyst   Procedure: DILATATION & CURETTAGE/HYSTEROSCOPY WITH NOVASURE ABLATION LAPAROSCOPIC TUBAL LIGATION LAPAROSCOPIC OVARIAN CYSTECTOMY   Anesthesia: General    Attending: Osborn Coho, MD   Assistant:  Henreitta Leber, [facility]  Findings: Approx 1-2cm polyp.  Approx 5cm left ovarian simple cyst. Ut sound 8cm, Cervical length 4cm, Cavity length 4cm, Cavity Width 4.2cm, Power 92 watts, Ablation time 105 secs.  Pathology: 1.Cyst Wall 2.Endometrial Curettings  Fluids: See flowsheet  UOP: See flowsheet  EBL: Minimal   Complications: None  Procedure: The patient was taken to the operating room after the risks, benefits, alternatives, complications, treatment options, and expected outcomes were discussed with the patient. The patient verbalized understanding, the patient concurred with the proposed plan and consent signed and witnessed. The patient was taken to the Operating Room, identified as Chelsea Byrd and the procedure verified as laparoscopic bilateral tubal fulguration, possible cystectomy, hysteroscopy, D&C and Ablation. A Time Out was held and the above information confirmed.  The patient was placed under general anesthesia per anesthesia staff, the patient was placed in modified dorsal lithotomy position and was prepped, draped, and catheterized in the normal, sterile fashion.  The cervix was visualized and an intrauterine manipulator was placed. A  10 mm umbilical incision was then performed. Veress needle was passed and pneumoperitoneum was established. A 10 mm trocar was advanced into the intraabdominal cavity (through omentum but after inspection, no bowel was identified), the operative laparoscope was introduced and findings as noted above.  Patient was placed in trendelenburg and marcaine injected in the LLQ and a 5 mm incision was made and 5  mm trocar advanced into the intraabdominal cavity.  The same was done in the suprapubic area.   The bilateral tubal fulguration within perform have to carry each tube out to its fimbriated in. Cauterization was performed for at least three consecutive burns in the isthmic portion.  Attention was been turned to the left ovarian cyst which was approximately 45 cm. Using the monopolar of the overlying ovarian tissue was inside and the cyst wall dissected out.  There was inadvertent rupture of the cyst with clear fluid noted. The cyst wall was dissected out in pieces and sent to pathology. Irrigation was performed and good hemostasis of the ovarian bed was noted after cauterizing with the bipolar Klepingers.  Pneumoperitoneum was relieved and all trocars removed under direct visualization. As the umbilical incision was previously made at the site of the patient's scar where her belly button ring had been located, the fascia was repaired at this site with 0 vicryl via a figure of stitch.  The skin was reapproximated using 3-0 Monocryl via a subcuticular stitch. All incisions were then dressed with dermabond which included the left lower quadrant and suprapubic incisions as well as the 10 mm incision at the umbilicus.  Attention was then turned to the perineum where the Intrauterine manipulator was removed. Speculum was placed in the patient's vagina and the anterior lip of the cervix was grasped with a single tooth tenaculum. A paracervical block was administered using a total of 10 mL of 1% lidocaine. The uterus was sounded to 8 cm and the cervical length measured 4 cm. The cervix was dilated for passage of the hysteroscope which was introduced into the uterine cavity. A polyp was noted in the cervical canal which was removed with polyp forceps and curettage. This was sent to pathology. There were  no obvious intracavitary lesions that were noted otherwise. Curettage was performed until a gritty texture was noted  and curettings sent to pathology.  The Novasure instrument was then introduced into the uterine cavity and ablation performed without difficulty at a power of 92 watts for 105 seconds.  Hysteroscope was reintroduced into the uterine cavity and good ablation results were noted. All instruments were removed. Sponge lap and needle count was correct. The patient tolerated the procedure well and was returned the recovery room in good condition.

## 2011-08-19 NOTE — Telephone Encounter (Signed)
LM that we called in Vicodin 5/500 mg sig 1 po q 6 hrs prn # 30/ 0 RF's to her pharmacy. Chelsea Byrd A

## 2011-08-19 NOTE — Telephone Encounter (Signed)
Called pt again, after consulting with AR who said she is taking too much Percocet. She should be taking Motrin 1st and Percocet for the breakthrough pain. No RF on Percocet at this time.Melody Comas A

## 2011-08-22 ENCOUNTER — Ambulatory Visit: Payer: BC Managed Care – HMO | Admitting: Obstetrics and Gynecology

## 2011-08-31 ENCOUNTER — Encounter: Payer: BC Managed Care – HMO | Admitting: Obstetrics and Gynecology

## 2011-09-06 ENCOUNTER — Telehealth: Payer: Self-pay | Admitting: Obstetrics and Gynecology

## 2011-09-08 ENCOUNTER — Telehealth: Payer: Self-pay | Admitting: Obstetrics and Gynecology

## 2011-09-08 NOTE — Telephone Encounter (Signed)
Pt was called regarding previous message about her missing her post-op visit. Pt is schedule for 7/12. Chelsea Byrd

## 2011-09-30 ENCOUNTER — Ambulatory Visit (INDEPENDENT_AMBULATORY_CARE_PROVIDER_SITE_OTHER): Payer: BC Managed Care – PPO | Admitting: Obstetrics and Gynecology

## 2011-09-30 ENCOUNTER — Encounter: Payer: Self-pay | Admitting: Obstetrics and Gynecology

## 2011-09-30 VITALS — BP 102/62 | HR 68 | Temp 98.8°F | Ht 64.0 in | Wt 133.0 lb

## 2011-09-30 DIAGNOSIS — N920 Excessive and frequent menstruation with regular cycle: Secondary | ICD-10-CM

## 2011-09-30 DIAGNOSIS — Z9889 Other specified postprocedural states: Secondary | ICD-10-CM

## 2011-09-30 DIAGNOSIS — Z302 Encounter for sterilization: Secondary | ICD-10-CM

## 2011-09-30 NOTE — Progress Notes (Signed)
DATE OF SURGERY: 08/18/2011 TYPE OF SURGERY: DILATATION & CURETTAGE/HYSTEROSCOPY WITH NOVASURE ABLATION  LAPAROSCOPIC TUBAL LIGATION  LAPAROSCOPIC OVARIAN CYSTECTOMY     PAIN:No VAG BLEEDING: no VAG DISCHARGE: no NORMAL GI FUNCTN: yes NORMAL GU FUNCTN: yes  No complaints  Filed Vitals:   09/30/11 1327  BP: 102/62  Pulse: 68  Temp: 98.8 F (37.1 C)   Path reviewed - benign findings and benign polyp  ROS: noncontributory  Pelvic exam:  VULVA: normal appearing vulva with no masses, tenderness or lesions,  VAGINA: normal appearing vagina with normal color and discharge, no lesions, CERVIX: normal appearing cervix without discharge or lesions,  UTERUS: uterus is normal size, shape, consistency and nontender,  ADNEXA: normal adnexa in size, nontender and no masses.  Well healed incisions  A/P Doing well s/p Lap BTL, Cystectomy, Hyst/D&C/ Ablation RTO next available for AEX

## 2012-01-02 ENCOUNTER — Ambulatory Visit (INDEPENDENT_AMBULATORY_CARE_PROVIDER_SITE_OTHER): Payer: BC Managed Care – PPO

## 2012-01-02 ENCOUNTER — Ambulatory Visit (INDEPENDENT_AMBULATORY_CARE_PROVIDER_SITE_OTHER): Payer: BC Managed Care – PPO | Admitting: Obstetrics and Gynecology

## 2012-01-02 ENCOUNTER — Telehealth: Payer: Self-pay | Admitting: Obstetrics and Gynecology

## 2012-01-02 ENCOUNTER — Encounter: Payer: Self-pay | Admitting: Obstetrics and Gynecology

## 2012-01-02 VITALS — BP 102/70 | HR 62 | Temp 98.7°F | Ht 64.0 in | Wt 135.0 lb

## 2012-01-02 DIAGNOSIS — N949 Unspecified condition associated with female genital organs and menstrual cycle: Secondary | ICD-10-CM

## 2012-01-02 DIAGNOSIS — R102 Pelvic and perineal pain: Secondary | ICD-10-CM

## 2012-01-02 DIAGNOSIS — Z01419 Encounter for gynecological examination (general) (routine) without abnormal findings: Secondary | ICD-10-CM

## 2012-01-02 DIAGNOSIS — Z124 Encounter for screening for malignant neoplasm of cervix: Secondary | ICD-10-CM

## 2012-01-02 DIAGNOSIS — Z8742 Personal history of other diseases of the female genital tract: Secondary | ICD-10-CM

## 2012-01-02 LAB — POCT URINALYSIS DIPSTICK
Protein, UA: NEGATIVE
Spec Grav, UA: <=1.005
Urobilinogen, UA: NEGATIVE

## 2012-01-02 NOTE — Progress Notes (Addendum)
Subjective:    Chelsea Byrd is a 36 y.o. female, [id number], who presents for an annual exam. The patient reports pelvic cramping daily x 6 weeks ago (felt like she was going to get her period) breast were really sore. Pain is crampy 4-6/10 on a 10 point pain scale but decrease to 4/10 with Ibuprofen. Denies urinary tract symptoms or changes in bowel movements (have always been slow) but admits to positional dyspareunia and spotting over the past 4 days.   Menstrual cycle:   LMP: No LMP recorded. Patient has had an ablation.  Surgery; 08/18/11: Tubal Sterilization, Left Ovarian Cystectomy and Endometrial Ablation             Review of Systems Pertinent items are noted in HPI. Denies pelvic pain, urinary tract symptoms, vaginitis symptoms, irregular bleeding, menopausal symptoms, change in bowel habits or rectal bleeding   Objective:    BP 102/70  Pulse 62  Ht 5\' 4"  (1.626 m)  Wt 135 lb (61.236 kg)  BMI 23.17 kg/m2   Temperature:98.7 degrees orally Wt Readings from Last 1 Encounters:  01/02/12 135 lb (61.236 kg)   Body mass index is 23.17 kg/(m^2). General Appearance: Alert, no acute distress HEENT: Grossly normal Neck / Thyroid: Supple, no thyromegaly or cervical adenopathy Lungs: Clear to auscultation bilaterally Back: No CVA tenderness Breast Exam: No masses or nodes.No dimpling, nipple retraction or discharge. Cardiovascular: Regular rate and rhythm.  Gastrointestinal: Soft,mild right sided tenderness without guarding, no masses or organomegaly Pelvic Exam: EGBUS-wnl, vagina-brown discharge, cervix- without lesions or tenderness, uterus appears normal size shape and consistency, adnexae-right tenderness with palpable mass that is tender Lymphatic Exam: Non-palpable nodes in neck, clavicular,  axillary, or inguinal regions  Skin: no rashes or abnormalities Extremities: no clubbing cyanosis or edema  Neurologic: grossly normal Psychiatric: Alert and oriented  Urinalysis:  negative (except blood) UPT: negative   Assessment:   Routine GYN Exam Pelvic Pain S/P Tubal Sterilization/Ablation/Ovarian Cystectomy   Plan:  Pelvic Ultrasound: uterus-7.32 x 4.27 x 3.43 cm with endometrium-0.294 cm, both ovaries appear within normal limits, no free fluid  To consult Dr. Su Hilt as to patient's symptoms and recommendation for further evaluation and management Advised to give patient a trial of Micronor and have her follow up with her in a month. Patient had to leave before obtaining this direction, so will give her a call.   PAP sent  RTO 1 year or prn  Ferdie Bakken,ELMIRAPA-C

## 2012-01-02 NOTE — Progress Notes (Signed)
Regular Periods: no Mammogram: no  Monthly Breast Ex.: yes Exercise: yes  Tetanus < 10 years: no Seatbelts: yes  NI. Bladder Functn.: yes Abuse at home: no  Daily BM's: yes Stressful Work: yes  Healthy Diet: yes Sigmoid-Colonoscopy: no  Calcium: yes Medical problems this year: cramping x 6 weeks;? why   LAST PAP:5/12   [place]  Contraception: BTL   Mammogram:  NO  PCP: DR. Orvan Falconer IN [place]  PMH: NO CHANGE  FMH: NO CHANGE  Last Bone Scan: NO    PT IS SINGLE

## 2012-01-02 NOTE — Telephone Encounter (Signed)
Call to patient, after consultation with Dr. Su Hilt, to advise her recommendation for a trial of Micronor x 1 cycle and follow up with her in 1 month.  Left message stating the same but also gave the option of making an appointment with Dr. Su Hilt to further discuss her symptoms and plan options.  Patient was advised to call back with her decision.  Chelsea Witherow, PA-C

## 2012-01-03 ENCOUNTER — Telehealth: Payer: Self-pay | Admitting: Obstetrics and Gynecology

## 2012-01-03 ENCOUNTER — Other Ambulatory Visit: Payer: Self-pay | Admitting: Obstetrics and Gynecology

## 2012-01-03 DIAGNOSIS — G8918 Other acute postprocedural pain: Secondary | ICD-10-CM

## 2012-01-03 LAB — PAP IG W/ RFLX HPV ASCU

## 2012-01-03 MED ORDER — NORETHINDRONE 0.35 MG PO TABS: 1.0000 | ORAL_TABLET | Freq: Every day | ORAL | Status: DC

## 2012-01-03 MED ORDER — HYDROCODONE-ACETAMINOPHEN 5-500 MG PO TABS: 1.0000 | ORAL_TABLET | ORAL | Status: AC | PRN

## 2012-01-03 NOTE — Telephone Encounter (Signed)
TC to pt. Informed Rx was ordered. Scheduled F/U with Dr Alinda Sierras 01/27/12. Rx for Vicodin called to [place] at [facility] [address]

## 2012-01-03 NOTE — Telephone Encounter (Signed)
Patient accepted plan to take Micronor for a month and follow up with Dr. Su Hilt afterwards for pelvic pain.  Prescription ordered  for Micronor and printed prescription for Vicodin generated for patient to pick up and take for pain.  She should be scheduled with Dr. Su Hilt in one month.  Koleson Reifsteck, PA-C

## 2012-01-03 NOTE — Progress Notes (Signed)
Patient returned phone call in which Dr. Su Hilt' recommendation was to have patent try a trial of Micronor and follow up with her in one month for her pelvic pain.  Patient agreed to this plan thus Micronor e-prescribed and patient given a prescription for Vicodin 5/325 mg # 30 1 po q 4 hours prn-pain-no refills. Elany Felix, PA-C

## 2012-01-27 ENCOUNTER — Encounter: Payer: Self-pay | Admitting: Obstetrics and Gynecology

## 2012-01-27 ENCOUNTER — Ambulatory Visit (INDEPENDENT_AMBULATORY_CARE_PROVIDER_SITE_OTHER): Payer: BC Managed Care – PPO | Admitting: Obstetrics and Gynecology

## 2012-01-27 VITALS — BP 110/78 | Resp 14 | Ht 64.0 in | Wt 134.0 lb

## 2012-01-27 DIAGNOSIS — N951 Menopausal and female climacteric states: Secondary | ICD-10-CM

## 2012-01-27 DIAGNOSIS — N946 Dysmenorrhea, unspecified: Secondary | ICD-10-CM

## 2012-01-27 LAB — FOLLICLE STIMULATING HORMONE: FSH: 8.1 m[IU]/mL

## 2012-01-27 NOTE — Progress Notes (Signed)
Here secondary to cramping, breast tenderness (but recently started working out), mood swings.  Micronor not helping.  Not having cycles but brown d/c at times.  Filed Vitals:   01/27/12 1105  BP: 110/78  Resp: 14   ROS: noncontributory  Pelvic exam:  VULVA: normal appearing vulva with no masses, tenderness or lesions,  VAGINA: normal appearing vagina with normal color and discharge, no lesions, brown d/c (from light cycle) CERVIX: normal appearing cervix without discharge or lesions,  UTERUS: uterus is normal size, shape, consistency and nontender,  ADNEXA: normal adnexa in size, nontender and no masses.  A/P D/c micronor rec motrin for cramping - likely post ablation SE (denies any GI issues) Offered trial of low dose OCP - Pt declined for now (she had BTL to stop taking pills) Will obs for now (rec for at least )  If still persists, may consider the pills she says

## 2013-01-29 ENCOUNTER — Encounter: Payer: Self-pay | Admitting: Obstetrics and Gynecology

## 2013-03-11 ENCOUNTER — Other Ambulatory Visit: Payer: Self-pay | Admitting: Obstetrics and Gynecology

## 2013-03-19 ENCOUNTER — Encounter (HOSPITAL_COMMUNITY): Payer: Self-pay | Admitting: Pharmacist

## 2013-03-20 ENCOUNTER — Other Ambulatory Visit: Payer: Self-pay | Admitting: Obstetrics and Gynecology

## 2013-03-25 ENCOUNTER — Other Ambulatory Visit (HOSPITAL_COMMUNITY): Payer: Self-pay | Admitting: Obstetrics and Gynecology

## 2013-03-25 NOTE — H&P (Signed)
Chelsea Byrd is a 38 y.o. 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  The patient underwent an endometrial ablation procedure 08/18/2011 and  reported only scant and random spotting for a period with associated cramps responsive to NSAIDS.  In August 2014 however, the patient noticed intermittent but frequent pelvic cramping with episodes of intense "labor-like" pains.  Two months later, this pain became a daily occurrence accompanied by non-positional dyspareunia, lower back pain and urinary frequency. Only narcotic analgesia provided her any relief.  She denies any change in bowel function (always has had constipation), fever, nausea, vomiting, diarrhea, abnormal vaginal bleeding or rectal bleeding.  A pelvic ultrasound in November 2014 showed a uterus: 5.87 x 5.30 x 4.17 cm, endometrium-0.294 cm with areas of fluid and irregular walls consistent with ablation;  right ovary: 3.57 x 2.97 x 2.51 cm and left ovary: 2.41 x 2.33 x 1.54 cm.  At that same time CMET and CBC were normal though hemoglobin was 11.9.  Though the patient has been advised of both medical and surgical management options for her symptoms, she's decided to proceed with hysterectomy as a means of definitive therapy   Past Medical History  OB History: G 5: P 1-0-4-1; SVB  2005, 7 lbs. 8 oz.  GYN History: menarche: 38 YO    LMP: ablation    Contracepton bilateral tubal ligation  The patient denies history of sexually transmitted disease.  Has a  history of abnormal PAP smear but it was normal with retesting;  Last PAP smear: 01/2013  Medical History: B12 deficiency,   anemia  Surgical History: 2013  Endometrial Ablation/Tubal Sterilzation/Left Ovarian Cystectomy Denies problems with anesthesia or history of blood transfusions  Family History: Heart Disease, Asthma,  Diabetes Mellitus, Thyroid Disease, Ovarian Cancer (Maternal Aunt, age 44), Renal  Disease, Melanoma and Prostate Cancer.  Social History: Married and employed in retail;  Denies tobacco use and rarely uses alcohol   Outpatient Encounter Prescriptions as of 03/25/2013  Medication Sig  . HYDROcodone-acetaminophen (NORCO/VICODIN) 5-325 MG per tablet Take 1 tablet by mouth every 6 (six) hours as needed for moderate pain.  Marland Kitchen ibuprofen (ADVIL,MOTRIN) 600 MG tablet Take 600 mg by mouth every 6 (six) hours as needed for moderate pain.    No Known Allergies  Denies sensitivity to peanuts, shellfish, soy, latex or adhesives.   ROS: Admits to contact lenses occasional constipation, urinary frequency, posiiional lightheadedness, and random joint pain;  Denies headache, vision changes, nasal congestion, dysphagia, tinnitus, hoarseness, cough,  chest pain, shortness of breath, nausea, vomiting, diarrhea,  urgency  dysuria, hematuria, vaginitis symptoms, pelvic pain, swelling of joints,easy bruising,  myalgias, skin rashes, unexplained weight loss and except as is mentioned in the history of present illness, patient's review of systems is otherwise negative.  Physical Exam  Bp: 108/62     P: 62     R: 14    Temperature: 98.9 degrees F orally    Weight: 142 lbs.   Height: 5'  4"   BMI:  24.4  Neck: supple without masses or thyromegaly Lungs: clear to auscultation Heart: regular rate and rhythm Abdomen: soft, diffusely tender with voluntary guarding and no organomegaly Pelvic:EGBUS- wnl; vagina-normal rugae and tender; uterus-normal size but tender, cervix without lesions or motion tenderness; adnexae-bilateral tenderness but no  masses Extremities:  no clubbing, cyanosis or edema  Endometrial biopsy: secretory endometrium, no hyperplasia, atypia or malignancy identified  Assesment: Chronic Pelvic Pain  Post-Ablation Syndrome   Disposition:  A discussion was held with patient regarding the indication for her procedure(s) along with the risks, which include but  are not limited to: reaction to anesthesia, damage to adjacent organs, infection,  excessive bleeding and pelvic prolapse.  Given Miralax Bowel Prep to be completed 24 hours prior to surgery.  The patient verbalized understanding of these risks along with pre-operative instructions and has consented to proceed with a Total Laparoscopically Hysterectomy with Bilateral Salpingectomy and Cystoscopy, possible Laparoscopically Assisted Vaginal Hysterectomy and possible [facility] Abdominal [facility] on April 01, 2013 at 9:30 a. m.   CSN# [id number]   Lucifer Soja J. Lowell GuitarPowell, PA-C  for Dr. Woodroe ModeAngela Y. Su Hiltoberts

## 2013-03-28 NOTE — Patient Instructions (Addendum)
    Your procedure is scheduled on:  Monday, Jan 12  Enter through the Main Entrance of [facility] at:  830 AM Pick up the phone at the desk and dial [phone] and inform us of your arrival.  Please call this number if you have any problems the morning of surgery: [phone]  Remember: Do not eat or drink after midnight: Sunday Take these medicines the morning of surgery with a SIP OF WATER:  None  Do not wear jewelry, make-up, or FINGER nail polish. (No metal in your hair or on your body). Do not wear lotions, powders, perfumes.  You may wear deodorant.  Please use your CHG wash as directed prior to surgery.  Do not bring valuables to the hospital. Contacts, dentures or bridgework may not be worn into surgery.  Leave suitcase in the car. After Surgery it may be brought to your room. For patients being admitted to the hospital, checkout time is 11:00am the day of discharge.  Home with husband Joe.

## 2013-03-29 ENCOUNTER — Encounter (HOSPITAL_COMMUNITY): Payer: Self-pay

## 2013-03-29 ENCOUNTER — Encounter (HOSPITAL_COMMUNITY)
Admission: RE | Admit: 2013-03-29 | Discharge: 2013-03-29 | Disposition: A | Discharge status: Home / Self Care | Payer: BC Managed Care – PPO | Source: Ambulatory Visit | Attending: Obstetrics and Gynecology | Admitting: Obstetrics and Gynecology

## 2013-03-29 ENCOUNTER — Encounter (INDEPENDENT_AMBULATORY_CARE_PROVIDER_SITE_OTHER): Payer: Self-pay

## 2013-03-29 LAB — CBC
HCT: 36.6 % (ref 36.0–46.0)
Hemoglobin: 11.7 g/dL — ABNORMAL LOW (ref 12.0–15.0)
MCH: 23.8 pg — AB (ref 26.0–34.0)
MCHC: 32 g/dL (ref 30.0–36.0)
MCV: 74.4 fL — AB (ref 78.0–100.0)
PLATELETS: 265 10*3/uL (ref 150–400)
RBC: 4.92 MIL/uL (ref 3.87–5.11)
RDW: 13.1 % (ref 11.5–15.5)
WBC: 6.1 10*3/uL (ref 4.0–10.5)

## 2013-03-31 MED ORDER — DEXTROSE 5 % IV SOLN
2.0000 g | INTRAVENOUS | Status: AC
  Administered 2013-04-01: 2 g via INTRAVENOUS
  Filled 2013-03-31: qty 2

## 2013-04-01 ENCOUNTER — Encounter (HOSPITAL_COMMUNITY): Payer: BC Managed Care – PPO | Admitting: Anesthesiology

## 2013-04-01 ENCOUNTER — Ambulatory Visit (HOSPITAL_COMMUNITY)
Admission: RE | Admit: 2013-04-01 | Discharge: 2013-04-02 | Disposition: A | Discharge status: Home / Self Care | Payer: BC Managed Care – PPO | Source: Ambulatory Visit | Attending: Obstetrics and Gynecology | Admitting: Obstetrics and Gynecology

## 2013-04-01 ENCOUNTER — Ambulatory Visit (HOSPITAL_COMMUNITY): Payer: BC Managed Care – PPO | Admitting: Anesthesiology

## 2013-04-01 ENCOUNTER — Encounter (HOSPITAL_COMMUNITY): Payer: Self-pay | Admitting: Anesthesiology

## 2013-04-01 ENCOUNTER — Encounter (HOSPITAL_COMMUNITY)
Admission: RE | Disposition: A | Discharge status: Home / Self Care | Payer: Self-pay | Source: Ambulatory Visit | Attending: Obstetrics and Gynecology

## 2013-04-01 DIAGNOSIS — D649 Anemia, unspecified: Secondary | ICD-10-CM | POA: Insufficient documentation

## 2013-04-01 DIAGNOSIS — N838 Other noninflammatory disorders of ovary, fallopian tube and broad ligament: Secondary | ICD-10-CM | POA: Insufficient documentation

## 2013-04-01 DIAGNOSIS — G8929 Other chronic pain: Secondary | ICD-10-CM | POA: Insufficient documentation

## 2013-04-01 DIAGNOSIS — N840 Polyp of corpus uteri: Secondary | ICD-10-CM | POA: Insufficient documentation

## 2013-04-01 DIAGNOSIS — R9389 Abnormal findings on diagnostic imaging of other specified body structures: Secondary | ICD-10-CM | POA: Insufficient documentation

## 2013-04-01 DIAGNOSIS — IMO0002 Reserved for concepts with insufficient information to code with codable children: Secondary | ICD-10-CM | POA: Insufficient documentation

## 2013-04-01 DIAGNOSIS — N8000 Endometriosis of the uterus, unspecified: Secondary | ICD-10-CM | POA: Insufficient documentation

## 2013-04-01 DIAGNOSIS — Z90710 Acquired absence of both cervix and uterus: Secondary | ICD-10-CM | POA: Diagnosis present

## 2013-04-01 DIAGNOSIS — N949 Unspecified condition associated with female genital organs and menstrual cycle: Secondary | ICD-10-CM | POA: Insufficient documentation

## 2013-04-01 DIAGNOSIS — K5900 Constipation, unspecified: Secondary | ICD-10-CM | POA: Insufficient documentation

## 2013-04-01 HISTORY — PX: LAPAROSCOPIC HYSTERECTOMY: SHX1926

## 2013-04-01 HISTORY — PX: CYSTOSCOPY: SHX5120

## 2013-04-01 HISTORY — PX: BILATERAL SALPINGECTOMY: SHX5743

## 2013-04-01 LAB — HCG, SERUM, QUALITATIVE: PREG SERUM: NEGATIVE

## 2013-04-01 SURGERY — HYSTERECTOMY, TOTAL, LAPAROSCOPIC
Anesthesia: General | Site: Abdomen

## 2013-04-01 MED ORDER — BUPIVACAINE HCL (PF) 0.25 % IJ SOLN
INTRAMUSCULAR | Status: AC
  Filled 2013-04-01: qty 30

## 2013-04-01 MED ORDER — LACTATED RINGERS IV SOLN
INTRAVENOUS | Status: DC
  Administered 2013-04-01 – 2013-04-02 (×3): via INTRAVENOUS

## 2013-04-01 MED ORDER — METHYLENE BLUE 1 % INJ SOLN
INTRAMUSCULAR | Status: AC
  Filled 2013-04-01: qty 10

## 2013-04-01 MED ORDER — HEPARIN SODIUM (PORCINE) 5000 UNIT/ML IJ SOLN
INTRAMUSCULAR | Status: AC
  Filled 2013-04-01: qty 1

## 2013-04-01 MED ORDER — SODIUM CHLORIDE 0.9 % IJ SOLN: 9.0000 mL | INTRAMUSCULAR | Status: DC | PRN

## 2013-04-01 MED ORDER — MEPERIDINE HCL 25 MG/ML IJ SOLN
6.2500 mg | INTRAMUSCULAR | Status: DC | PRN
  Administered 2013-04-01: 12.5 mg via INTRAVENOUS

## 2013-04-01 MED ORDER — PROPOFOL 10 MG/ML IV EMUL
INTRAVENOUS | Status: AC
  Filled 2013-04-01: qty 20

## 2013-04-01 MED ORDER — ESTRADIOL 0.1 MG/GM VA CREA
TOPICAL_CREAM | VAGINAL | Status: AC
  Filled 2013-04-01: qty 42.5

## 2013-04-01 MED ORDER — EPINEPHRINE HCL 0.1 MG/ML IJ SOSY
PREFILLED_SYRINGE | INTRAMUSCULAR | Status: AC
  Filled 2013-04-01: qty 10

## 2013-04-01 MED ORDER — FENTANYL CITRATE 0.05 MG/ML IJ SOLN
INTRAMUSCULAR | Status: AC
  Administered 2013-04-01: 50 ug via INTRAVENOUS
  Filled 2013-04-01: qty 2

## 2013-04-01 MED ORDER — MEPERIDINE HCL 25 MG/ML IJ SOLN
INTRAMUSCULAR | Status: AC
  Administered 2013-04-01: 12.5 mg via INTRAVENOUS
  Filled 2013-04-01: qty 1

## 2013-04-01 MED ORDER — DIPHENHYDRAMINE HCL 50 MG/ML IJ SOLN: 12.5000 mg | Freq: Four times a day (QID) | INTRAMUSCULAR | Status: DC | PRN

## 2013-04-01 MED ORDER — ROCURONIUM BROMIDE 100 MG/10ML IV SOLN
INTRAVENOUS | Status: AC
  Filled 2013-04-01: qty 1

## 2013-04-01 MED ORDER — HYDROMORPHONE 0.3 MG/ML IV SOLN
INTRAVENOUS | Status: DC
  Administered 2013-04-01: 16:00:00 via INTRAVENOUS
  Filled 2013-04-01: qty 25

## 2013-04-01 MED ORDER — DIPHENHYDRAMINE HCL 12.5 MG/5ML PO ELIX
12.5000 mg | ORAL_SOLUTION | Freq: Four times a day (QID) | ORAL | Status: DC | PRN
  Administered 2013-04-01: 12.5 mg via ORAL
  Filled 2013-04-01: qty 5

## 2013-04-01 MED ORDER — LACTATED RINGERS IV SOLN
INTRAVENOUS | Status: DC
  Administered 2013-04-01 (×3): via INTRAVENOUS

## 2013-04-01 MED ORDER — GLYCOPYRROLATE 0.2 MG/ML IJ SOLN
INTRAMUSCULAR | Status: AC
  Filled 2013-04-01: qty 3

## 2013-04-01 MED ORDER — STERILE WATER FOR IRRIGATION IR SOLN
Status: DC | PRN
  Administered 2013-04-01: 300 mL

## 2013-04-01 MED ORDER — IBUPROFEN 600 MG PO TABS: 600.0000 mg | ORAL_TABLET | Freq: Four times a day (QID) | ORAL | Status: DC | PRN

## 2013-04-01 MED ORDER — METHYLENE BLUE 1 % INJ SOLN
INTRAMUSCULAR | Status: DC | PRN
  Administered 2013-04-01 (×2): 5 mL via INTRAVENOUS

## 2013-04-01 MED ORDER — GLYCOPYRROLATE 0.2 MG/ML IJ SOLN
INTRAMUSCULAR | Status: DC | PRN
  Administered 2013-04-01: 0.6 mg via INTRAVENOUS

## 2013-04-01 MED ORDER — FENTANYL CITRATE 0.05 MG/ML IJ SOLN
INTRAMUSCULAR | Status: AC
  Filled 2013-04-01: qty 5

## 2013-04-01 MED ORDER — LIDOCAINE HCL (CARDIAC) 20 MG/ML IV SOLN
INTRAVENOUS | Status: AC
  Filled 2013-04-01: qty 5

## 2013-04-01 MED ORDER — NEOSTIGMINE METHYLSULFATE 1 MG/ML IJ SOLN
INTRAMUSCULAR | Status: AC
  Filled 2013-04-01: qty 1

## 2013-04-01 MED ORDER — MIDAZOLAM HCL 2 MG/2ML IJ SOLN
INTRAMUSCULAR | Status: AC
  Filled 2013-04-01: qty 2

## 2013-04-01 MED ORDER — SODIUM CHLORIDE 0.9 % IJ SOLN
INTRAMUSCULAR | Status: AC
  Filled 2013-04-01: qty 100

## 2013-04-01 MED ORDER — MIDAZOLAM HCL 2 MG/2ML IJ SOLN: 0.5000 mg | Freq: Once | INTRAMUSCULAR | Status: DC | PRN

## 2013-04-01 MED ORDER — KETOROLAC TROMETHAMINE 30 MG/ML IJ SOLN
INTRAMUSCULAR | Status: AC
  Filled 2013-04-01: qty 1

## 2013-04-01 MED ORDER — LACTATED RINGERS IR SOLN
Status: DC | PRN
  Administered 2013-04-01: 3000 mL

## 2013-04-01 MED ORDER — HYDROMORPHONE 0.3 MG/ML IV SOLN
INTRAVENOUS | Status: DC
  Administered 2013-04-01: 0.5 mg via INTRAVENOUS
  Administered 2013-04-01: 23:00:00 via INTRAVENOUS
  Administered 2013-04-01: 2.19 mg via INTRAVENOUS
  Administered 2013-04-01 – 2013-04-02 (×2): 3 mg via INTRAVENOUS
  Administered 2013-04-02: 2.4 mg via INTRAVENOUS
  Filled 2013-04-01 (×2): qty 25

## 2013-04-01 MED ORDER — DIPHENHYDRAMINE HCL 12.5 MG/5ML PO ELIX: 12.5000 mg | ORAL_SOLUTION | Freq: Four times a day (QID) | ORAL | Status: DC | PRN

## 2013-04-01 MED ORDER — ONDANSETRON HCL 4 MG/2ML IJ SOLN: 4.0000 mg | Freq: Four times a day (QID) | INTRAMUSCULAR | Status: DC | PRN

## 2013-04-01 MED ORDER — HYDROMORPHONE HCL PF 1 MG/ML IJ SOLN
0.2500 mg | INTRAMUSCULAR | Status: DC | PRN
  Administered 2013-04-01 (×4): 0.5 mg via INTRAVENOUS

## 2013-04-01 MED ORDER — NALOXONE HCL 0.4 MG/ML IJ SOLN: 0.4000 mg | INTRAMUSCULAR | Status: DC | PRN

## 2013-04-01 MED ORDER — LIDOCAINE HCL (CARDIAC) 20 MG/ML IV SOLN
INTRAVENOUS | Status: DC | PRN
  Administered 2013-04-01: 20 mg via INTRAVENOUS
  Administered 2013-04-01: 70 mg via INTRAVENOUS

## 2013-04-01 MED ORDER — OXYCODONE-ACETAMINOPHEN 5-325 MG PO TABS: 1.0000 | ORAL_TABLET | ORAL | Status: DC | PRN

## 2013-04-01 MED ORDER — ONDANSETRON HCL 4 MG/2ML IJ SOLN
4.0000 mg | Freq: Four times a day (QID) | INTRAMUSCULAR | Status: DC | PRN
  Administered 2013-04-01: 4 mg via INTRAVENOUS
  Filled 2013-04-01: qty 2

## 2013-04-01 MED ORDER — INDIGOTINDISULFONATE SODIUM 8 MG/ML IJ SOLN
INTRAMUSCULAR | Status: DC | PRN
  Administered 2013-04-01: 5 mL via INTRAVENOUS

## 2013-04-01 MED ORDER — NEOSTIGMINE METHYLSULFATE 1 MG/ML IJ SOLN
INTRAMUSCULAR | Status: DC | PRN
  Administered 2013-04-01: 3 mg via INTRAVENOUS

## 2013-04-01 MED ORDER — DEXAMETHASONE SODIUM PHOSPHATE 10 MG/ML IJ SOLN
INTRAMUSCULAR | Status: DC | PRN
  Administered 2013-04-01: 10 mg via INTRAVENOUS

## 2013-04-01 MED ORDER — KETOROLAC TROMETHAMINE 30 MG/ML IJ SOLN: 30.0000 mg | Freq: Four times a day (QID) | INTRAMUSCULAR | Status: DC

## 2013-04-01 MED ORDER — DEXAMETHASONE SODIUM PHOSPHATE 10 MG/ML IJ SOLN
INTRAMUSCULAR | Status: AC
  Filled 2013-04-01: qty 1

## 2013-04-01 MED ORDER — BUPIVACAINE HCL (PF) 0.25 % IJ SOLN
INTRAMUSCULAR | Status: DC | PRN
  Administered 2013-04-01: 14 mL

## 2013-04-01 MED ORDER — ONDANSETRON HCL 4 MG/2ML IJ SOLN
INTRAMUSCULAR | Status: DC | PRN
  Administered 2013-04-01: 4 mg via INTRAVENOUS

## 2013-04-01 MED ORDER — FENTANYL CITRATE 0.05 MG/ML IJ SOLN
25.0000 ug | INTRAMUSCULAR | Status: DC | PRN
  Administered 2013-04-01 (×4): 50 ug via INTRAVENOUS

## 2013-04-01 MED ORDER — HYDROMORPHONE HCL PF 1 MG/ML IJ SOLN
INTRAMUSCULAR | Status: AC
  Administered 2013-04-01: 0.5 mg via INTRAVENOUS
  Filled 2013-04-01: qty 1

## 2013-04-01 MED ORDER — ROCURONIUM BROMIDE 100 MG/10ML IV SOLN
INTRAVENOUS | Status: DC | PRN
  Administered 2013-04-01: 10 mg via INTRAVENOUS
  Administered 2013-04-01: 50 mg via INTRAVENOUS
  Administered 2013-04-01: 5 mg via INTRAVENOUS

## 2013-04-01 MED ORDER — KETOROLAC TROMETHAMINE 30 MG/ML IJ SOLN
INTRAMUSCULAR | Status: DC | PRN
  Administered 2013-04-01: 30 mg via INTRAVENOUS

## 2013-04-01 MED ORDER — MIDAZOLAM HCL 2 MG/2ML IJ SOLN
INTRAMUSCULAR | Status: DC | PRN
  Administered 2013-04-01: 2 mg via INTRAVENOUS

## 2013-04-01 MED ORDER — KETOROLAC TROMETHAMINE 30 MG/ML IJ SOLN: 15.0000 mg | Freq: Once | INTRAMUSCULAR | Status: DC | PRN

## 2013-04-01 MED ORDER — VASOPRESSIN 20 UNIT/ML IJ SOLN
INTRAMUSCULAR | Status: AC
  Filled 2013-04-01: qty 1

## 2013-04-01 MED ORDER — PROPOFOL 10 MG/ML IV BOLUS
INTRAVENOUS | Status: DC | PRN
  Administered 2013-04-01: 170 mg via INTRAVENOUS

## 2013-04-01 MED ORDER — ONDANSETRON HCL 4 MG/2ML IJ SOLN
INTRAMUSCULAR | Status: AC
  Filled 2013-04-01: qty 2

## 2013-04-01 MED ORDER — FENTANYL CITRATE 0.05 MG/ML IJ SOLN
INTRAMUSCULAR | Status: DC | PRN
  Administered 2013-04-01: 100 ug via INTRAVENOUS
  Administered 2013-04-01: 50 ug via INTRAVENOUS
  Administered 2013-04-01: 100 ug via INTRAVENOUS
  Administered 2013-04-01 (×5): 50 ug via INTRAVENOUS

## 2013-04-01 MED ORDER — SODIUM CHLORIDE 0.9 % IJ SOLN
INTRAMUSCULAR | Status: AC
  Filled 2013-04-01: qty 10

## 2013-04-01 MED ORDER — PROMETHAZINE HCL 25 MG/ML IJ SOLN: 6.2500 mg | INTRAMUSCULAR | Status: DC | PRN

## 2013-04-01 SURGICAL SUPPLY — 88 items
BARRIER ADHS 3X4 INTERCEED (GAUZE/BANDAGES/DRESSINGS) IMPLANT
CABLE HIGH FREQUENCY MONO STRZ (ELECTRODE) IMPLANT
CANISTER SUCT 3000ML (MISCELLANEOUS) ×5 IMPLANT
CATH ROBINSON RED A/P 16FR (CATHETERS) IMPLANT
CHLORAPREP W/TINT 26ML (MISCELLANEOUS) ×5 IMPLANT
CLOTH BEACON ORANGE TIMEOUT ST (SAFETY) ×5 IMPLANT
CONT PATH 16OZ SNAP LID 3702 (MISCELLANEOUS) ×5 IMPLANT
COVER LIGHT HANDLE  1/PK (MISCELLANEOUS) ×1
COVER LIGHT HANDLE 1/PK (MISCELLANEOUS) ×4 IMPLANT
COVER MAYO STAND STRL (DRAPES) ×5 IMPLANT
COVER TABLE BACK 60X90 (DRAPES) ×5 IMPLANT
DECANTER SPIKE VIAL GLASS SM (MISCELLANEOUS) IMPLANT
DERMABOND ADVANCED (GAUZE/BANDAGES/DRESSINGS) ×1
DERMABOND ADVANCED .7 DNX12 (GAUZE/BANDAGES/DRESSINGS) ×4 IMPLANT
DISSECTOR BLUNT TIP ENDO 5MM (MISCELLANEOUS) IMPLANT
DISSECTOR SPONGE CHERRY (GAUZE/BANDAGES/DRESSINGS) ×5 IMPLANT
DRAPE HYSTEROSCOPY (DRAPE) ×5 IMPLANT
DRAPE PROXIMA HALF (DRAPES) ×5 IMPLANT
DRAPE WARM FLUID 44X44 (DRAPE) IMPLANT
ELECT REM PT RETURN 9FT ADLT (ELECTROSURGICAL) ×5
ELECTRODE REM PT RTRN 9FT ADLT (ELECTROSURGICAL) ×4 IMPLANT
EVACUATOR SMOKE 8.L (FILTER) ×5 IMPLANT
FORCEPS CUTTING 33CM 5MM (CUTTING FORCEPS) ×5 IMPLANT
GAUZE PACKING 2X5 YD STRL (GAUZE/BANDAGES/DRESSINGS) IMPLANT
GAUZE SPONGE 4X4 16PLY XRAY LF (GAUZE/BANDAGES/DRESSINGS) ×5 IMPLANT
GLOVE BIO SURGEON STRL SZ 6.5 (GLOVE) ×15 IMPLANT
GLOVE BIO SURGEON STRL SZ7.5 (GLOVE) ×5 IMPLANT
GLOVE BIOGEL PI IND STRL 7.5 (GLOVE) ×8 IMPLANT
GLOVE BIOGEL PI INDICATOR 7.5 (GLOVE) ×2
GOWN PREVENTION PLUS LG XLONG (DISPOSABLE) ×10 IMPLANT
GOWN STRL REIN XL XLG (GOWN DISPOSABLE) ×20 IMPLANT
HEMOSTAT SURGICEL 2X14 (HEMOSTASIS) IMPLANT
HEMOSTAT SURGICEL 2X3 (HEMOSTASIS) ×5 IMPLANT
HEMOSTAT SURGICEL 4X8 (HEMOSTASIS) IMPLANT
NEEDLE HYPO 25X1 1.5 SAFETY (NEEDLE) ×5 IMPLANT
NEEDLE INSUFFLATION 120MM (ENDOMECHANICALS) ×5 IMPLANT
NEEDLE MAYO .5 CIRCLE (NEEDLE) IMPLANT
NS IRRIG 1000ML POUR BTL (IV SOLUTION) ×5 IMPLANT
OCCLUDER COLPOPNEUMO (BALLOONS) ×5 IMPLANT
PACK LAPAROSCOPY BASIN (CUSTOM PROCEDURE TRAY) IMPLANT
PACK LAVH (CUSTOM PROCEDURE TRAY) ×5 IMPLANT
PACK VAGINAL WOMENS (CUSTOM PROCEDURE TRAY) ×5 IMPLANT
PAD OB MATERNITY 4.3X12.25 (PERSONAL CARE ITEMS) ×5 IMPLANT
PROTECTOR NERVE ULNAR (MISCELLANEOUS) ×10 IMPLANT
SCALPEL HARMONIC ACE (MISCELLANEOUS) ×5 IMPLANT
SCISSORS LAP 5X35 DISP (ENDOMECHANICALS) ×5 IMPLANT
SET CYSTO W/LG BORE CLAMP LF (SET/KITS/TRAYS/PACK) ×5 IMPLANT
SET IRRIG TUBING LAPAROSCOPIC (IRRIGATION / IRRIGATOR) ×5 IMPLANT
SOLUTION ELECTROLUBE (MISCELLANEOUS) IMPLANT
SPONGE LAP 18X18 X RAY DECT (DISPOSABLE) ×10 IMPLANT
STAPLER VISISTAT 35W (STAPLE) IMPLANT
STRIP CLOSURE SKIN 1/4X3 (GAUZE/BANDAGES/DRESSINGS) IMPLANT
STRIP CLOSURE SKIN 1/4X4 (GAUZE/BANDAGES/DRESSINGS) IMPLANT
SUT CHROMIC 2 0 CT 1 (SUTURE) IMPLANT
SUT CHROMIC 2 0 SH (SUTURE) IMPLANT
SUT CHROMIC 2 0 TIES 18 (SUTURE) IMPLANT
SUT MNCRL AB 3-0 PS2 27 (SUTURE) IMPLANT
SUT MON AB 3-0 SH 27 (SUTURE) ×2
SUT MON AB 3-0 SH27 (SUTURE) ×8 IMPLANT
SUT MON AB 4-0 PS1 27 (SUTURE) ×5 IMPLANT
SUT PDS AB 1 CT1 36 (SUTURE) ×30 IMPLANT
SUT PDS AB 1 CTX 36 (SUTURE) IMPLANT
SUT PLAIN 2 0 XLH (SUTURE) IMPLANT
SUT VIC AB 0 CT1 18XCR BRD8 (SUTURE) IMPLANT
SUT VIC AB 0 CT1 27 (SUTURE) ×4
SUT VIC AB 0 CT1 27XBRD ANBCTR (SUTURE) ×16 IMPLANT
SUT VIC AB 0 CT1 36 (SUTURE) IMPLANT
SUT VIC AB 0 CT1 8-18 (SUTURE)
SUT VICRYL 0 27 CT2 27 ABS (SUTURE) ×5 IMPLANT
SUT VICRYL 0 TIES 12 18 (SUTURE) IMPLANT
SUT VICRYL 0 UR6 27IN ABS (SUTURE) ×10 IMPLANT
SYR 50ML LL SCALE MARK (SYRINGE) ×5 IMPLANT
SYR BULB IRRIGATION 50ML (SYRINGE) ×5 IMPLANT
SYR CONTROL 10ML LL (SYRINGE) ×5 IMPLANT
SYR TB 1ML LUER SLIP (SYRINGE) ×5 IMPLANT
TIP UTERINE 5.1X6CM LAV DISP (MISCELLANEOUS) IMPLANT
TIP UTERINE 6.7X10CM GRN DISP (MISCELLANEOUS) IMPLANT
TIP UTERINE 6.7X6CM WHT DISP (MISCELLANEOUS) ×5 IMPLANT
TIP UTERINE 6.7X8CM BLUE DISP (MISCELLANEOUS) IMPLANT
TOWEL OR 17X24 6PK STRL BLUE (TOWEL DISPOSABLE) ×10 IMPLANT
TRAY FOLEY CATH 14FR (SET/KITS/TRAYS/PACK) ×5 IMPLANT
TROCAR BALLN 12MMX100 BLUNT (TROCAR) ×5 IMPLANT
TROCAR XCEL NON-BLD 11X100MML (ENDOMECHANICALS) ×10 IMPLANT
TROCAR XCEL NON-BLD 5MMX100MML (ENDOMECHANICALS) ×10 IMPLANT
TROCAR XCEL OPT SLVE 5M 100M (ENDOMECHANICALS) IMPLANT
TUBING FILTER THERMOFLATOR (ELECTROSURGICAL) ×5 IMPLANT
WARMER LAPAROSCOPE (MISCELLANEOUS) ×5 IMPLANT
WATER STERILE IRR 1000ML POUR (IV SOLUTION) IMPLANT

## 2013-04-01 NOTE — Transfer of Care (Signed)
Immediate Anesthesia Transfer of Care Note  Patient: Chelsea Byrd  Procedure(s) Performed: Procedure(s): HYSTERECTOMY TOTAL LAPAROSCOPIC (N/A) CYSTOSCOPY (Bilateral) BILATERAL SALPINGECTOMY (Bilateral)  Patient Location: PACU  Anesthesia Type:General  Level of Consciousness: awake, alert , oriented and patient cooperative  Airway & Oxygen Therapy: Patient Spontanous Breathing and Patient connected to nasal cannula oxygen  Post-op Assessment: Report given to PACU RN and Post -op Vital signs reviewed and stable  Post vital signs: Reviewed and stable  Complications: No apparent anesthesia complications

## 2013-04-01 NOTE — H&P (View-Only) (Signed)
Chelsea Byrd is a 38 y.o. 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  The patient underwent an endometrial ablation procedure 08/18/2011 and  reported only scant and random spotting for a period with associated cramps responsive to NSAIDS.  In August 2014 however, the patient noticed intermittent but frequent pelvic cramping with episodes of intense "labor-like" pains.  Two months later, this pain became a daily occurrence accompanied by non-positional dyspareunia, lower back pain and urinary frequency. Only narcotic analgesia provided her any relief.  She denies any change in bowel function (always has had constipation), fever, nausea, vomiting, diarrhea, abnormal vaginal bleeding or rectal bleeding.  A pelvic ultrasound in November 2014 showed a uterus: 5.87 x 5.30 x 4.17 cm, endometrium-0.294 cm with areas of fluid and irregular walls consistent with ablation;  right ovary: 3.57 x 2.97 x 2.51 cm and left ovary: 2.41 x 2.33 x 1.54 cm.  At that same time CMET and CBC were normal though hemoglobin was 11.9.  Though the patient has been advised of both medical and surgical management options for her symptoms, she's decided to proceed with hysterectomy as a means of definitive therapy   Past Medical History  OB History: G 5: P 1-0-4-1; SVB  2005, 7 lbs. 8 oz.  GYN History: menarche: 38 YO    LMP: ablation    Contracepton bilateral tubal ligation  The patient denies history of sexually transmitted disease.  Has a  history of abnormal PAP smear but it was normal with retesting;  Last PAP smear: 01/2013  Medical History: B12 deficiency,   anemia  Surgical History: 2013  Endometrial Ablation/Tubal Sterilzation/Left Ovarian Cystectomy Denies problems with anesthesia or history of blood transfusions  Family History: Heart Disease, Asthma,  Diabetes Mellitus, Thyroid Disease, Ovarian Cancer (Maternal Aunt, age 44), Renal  Disease, Melanoma and Prostate Cancer.  Social History: Married and employed in retail;  Denies tobacco use and rarely uses alcohol   Outpatient Encounter Prescriptions as of 03/25/2013  Medication Sig  . HYDROcodone-acetaminophen (NORCO/VICODIN) 5-325 MG per tablet Take 1 tablet by mouth every 6 (six) hours as needed for moderate pain.  Marland Kitchen ibuprofen (ADVIL,MOTRIN) 600 MG tablet Take 600 mg by mouth every 6 (six) hours as needed for moderate pain.    No Known Allergies  Denies sensitivity to peanuts, shellfish, soy, latex or adhesives.   ROS: Admits to contact lenses occasional constipation, urinary frequency, posiiional lightheadedness, and random joint pain;  Denies headache, vision changes, nasal congestion, dysphagia, tinnitus, hoarseness, cough,  chest pain, shortness of breath, nausea, vomiting, diarrhea,  urgency  dysuria, hematuria, vaginitis symptoms, pelvic pain, swelling of joints,easy bruising,  myalgias, skin rashes, unexplained weight loss and except as is mentioned in the history of present illness, patient's review of systems is otherwise negative.  Physical Exam  Bp: 108/62     P: 62     R: 14    Temperature: 98.9 degrees F orally    Weight: 142 lbs.   Height: 5'  4"   BMI:  24.4  Neck: supple without masses or thyromegaly Lungs: clear to auscultation Heart: regular rate and rhythm Abdomen: soft, diffusely tender with voluntary guarding and no organomegaly Pelvic:EGBUS- wnl; vagina-normal rugae and tender; uterus-normal size but tender, cervix without lesions or motion tenderness; adnexae-bilateral tenderness but no  masses Extremities:  no clubbing, cyanosis or edema  Endometrial biopsy: secretory endometrium, no hyperplasia, atypia or malignancy identified  Assesment: Chronic Pelvic Pain  Post-Ablation Syndrome   Disposition:  A discussion was held with patient regarding the indication for her procedure(s) along with the risks, which include but  are not limited to: reaction to anesthesia, damage to adjacent organs, infection,  excessive bleeding and pelvic prolapse.  Given Miralax Bowel Prep to be completed 24 hours prior to surgery.  The patient verbalized understanding of these risks along with pre-operative instructions and has consented to proceed with a Total Laparoscopically Hysterectomy with Bilateral Salpingectomy and Cystoscopy, possible Laparoscopically Assisted Vaginal Hysterectomy and possible Total Abdominal [facility]ste[facility] at [facility] on April 01, 2013 at 9:30 a. m.   CSN# [id number]   Farah Lepak J. Lowell GuitarPowell, PA-C  for Dr. Woodroe ModeAngela Y. Su Hiltoberts

## 2013-04-01 NOTE — Interval H&P Note (Signed)
History and Physical Interval Note:  04/01/2013 9:38 AM  Chelsea Byrd  has presented today for surgery, with the diagnosis of Pelvic Pain, Anemia  3 hours  The various methods of treatment have been discussed with the patient and family. After consideration of risks, benefits and other options for treatment, the patient has consented to  Procedure(s): HYSTERECTOMY TOTAL LAPAROSCOPIC (N/A) POSS LAPAROSCOPIC ASSISTED VAGINAL HYSTERECTOMY (N/A) POSS HYSTERECTOMY ABDOMINAL (N/A) CYSTOSCOPY (Bilateral) as a surgical intervention .  The patient's history has been reviewed, patient examined, no change in status, stable for surgery.  I have reviewed the patient's chart and labs.  Questions were answered to the patient's satisfaction.     Purcell NailsOBERTS,Kimberlee Shoun Y

## 2013-04-01 NOTE — Anesthesia Preprocedure Evaluation (Signed)
Anesthesia Evaluation  Patient identified by MRN, date of birth, ID band Patient awake    Reviewed: Allergy & Precautions, H&P , Patient's Chart, lab work & pertinent test results, reviewed documented beta blocker date and time   History of Anesthesia Complications Negative for: history of anesthetic complications  Airway Mallampati: II TM Distance: >3 FB Neck ROM: full    Dental   Pulmonary former smoker,  breath sounds clear to auscultation        Cardiovascular Exercise Tolerance: Good Rhythm:regular Rate:Normal     Neuro/Psych    GI/Hepatic   Endo/Other    Renal/GU      Musculoskeletal   Abdominal   Peds  Hematology   Anesthesia Other Findings   Reproductive/Obstetrics                           Anesthesia Physical Anesthesia Plan  ASA: II  Anesthesia Plan: General ETT   Post-op Pain Management:    Induction:   Airway Management Planned:   Additional Equipment:   Intra-op Plan:   Post-operative Plan:   Informed Consent: I have reviewed the patients History and Physical, chart, labs and discussed the procedure including the risks, benefits and alternatives for the proposed anesthesia with the patient or authorized representative who has indicated his/her understanding and acceptance.   Dental Advisory Given  Plan Discussed with: CRNA and Surgeon  Anesthesia Plan Comments:         Anesthesia Quick Evaluation  

## 2013-04-01 NOTE — Anesthesia Postprocedure Evaluation (Signed)
Anesthesia Post Note  Patient: Chelsea Byrd  Procedure(s) Performed: Procedure(s) (LRB): HYSTERECTOMY TOTAL LAPAROSCOPIC (N/A) CYSTOSCOPY (Bilateral) BILATERAL SALPINGECTOMY (Bilateral)  Anesthesia type: General  Patient location: PACU  Post pain: Pain level controlled  Post assessment: Post-op Vital signs reviewed  Last Vitals:  Filed Vitals:   04/01/13 1530  BP: 104/73  Pulse: 87  Temp:   Resp: 11    Post vital signs: Reviewed  Level of consciousness: sedated  Complications: No apparent anesthesia complications

## 2013-04-01 NOTE — Op Note (Signed)
Preop Diagnosis: 1.Post Ablation Syndrome 2.Pelvic Pain 3.Anemia 4.h/o sterilization     Postop Diagnosis: 1.Post Ablation Syndrome 2.Pelvic Pain 3.Anemia     Procedure: HYSTERECTOMY TOTAL LAPAROSCOPIC BILATERAL SALPINGECTOMY CYSTOSCOPY   Anesthesia: General   Attending: Purcell NailsAngela Y Arron Mcnaught, MD   Assistant: Jaymes GraffNaima Dillard, MD  Findings: Less than 250g uterus.  Some adhesions of omentum to anterior abdominal wall near umbilicus but no bowel involved.    Pathology: 1.Uterus and Cervix 2.Bilateral portions of fallopian tubes  Fluids: See Flowsheet  UOP: See Flowsheet  EBL: Minimal  Complications: None  Procedure: The patient was taken to the operating room, placed under general anesthesia and prepped and draped in the normal sterile fashion. A Foley catheter was placed in the bladder. The uterus sounded to 8 cm. A weighted speculum and vaginal retractors were placed in the vagina. Tenaculum was placed on the anterior lip of the cervix.  A size 6 cm tip was used and the 3.5cm Koh ring with rumi was placed and sutured because the tip balloon could not be insufflated (likely secondary to h/o ablation).  The occluder was insufflated. Attention was then turned to the abdomen. A 10 mm infraumbilical incision was made with the scalpel after 10 cc of 25% percent Marcaine was used for local anesthesia. The Veress needle was placed in the intra-abdominal cavity and insufflation obtained.  An 11mm trochar was advanced into the intra-abdominal cavity and the laparoscope was introduced.  Two 5 mm trochars were placed in the right and left lower quadrants under direct visualization with the laparoscope and a 10 mm trochar was placed in the suprapubic area under direct visualization as well.  The harmonic scalpel was used to cauterize and cut the uterine ovarian ligaments bilaterally.   Both round ligaments were cauterized and cut with the harmonic scalpel as well and the bladder flap created with the  harmonic scalpel and removed away from the uterus. Both uterine arteries were cauterized and cut with harmonic scalpel.  The harmonic was used to circumscribe the [facility] and free the uterus and cervix. The uterus was then pulled into the vagina.  Both angles were sutured with 1 PDS.  The  remainder of the cuff was sutured with 1 PDS using interrupted stitches until the vaginal cuff was closed.  A total of approximately 5 sutures were used.   Irrigation was performed.  Gas was allowed to leave the abdomen to check for bleeders and all pedicles were seen to be hemostatic.  A piece of surgicel was placed at the cuff to ensure hemostasis where a bleeder at the level of the bladder flap was cauterized.  The patient was given methylene blue.  Cystoscopy was performed and both ureters were seen to efflux urine (the left more frequently than the right) but it was not clear that methylene blue effluxed out of either as it was fairly dilute.  After observing for about 20 minutes and noting the efflux of urine despite the inability to clearly visualize methylene blue (with the administration of 2 doses of 50 mg IV), the ureters were felt to be patent.  The bladder had full integrity with no suture or laceration visualized.  The vagina was inspected and the cuff was noted to be intact.  Attention was then turned back to the abdomen after removing top pair of gloves. The abdomen was reinsufflated with CO2 gas.   The abdomen and pelvis was copiously irrigated and  hemostasis was noted.  The right ureter was observed  and normal peristalsis was noted without any evidence of dilation.  All trochars were removed under direct visualization using the laparoscope.  The two 10 mm fascial incisions were reapproximated with 0 vicryl. The two 10 mm incisions were closed with 3-0 Monocryl via a subcuticular stitch.  All remaining skin incisions were closed with Dermabond and the 10 mm skin incisions were reinforced using Dermabond.  Sponge  lap and needle counts were correct.  The patient tolerated the procedure well and was returned to the PACU in stable condition

## 2013-04-02 ENCOUNTER — Encounter (HOSPITAL_COMMUNITY): Payer: Self-pay | Admitting: Obstetrics and Gynecology

## 2013-04-02 LAB — CBC
HCT: 31.7 % — ABNORMAL LOW (ref 36.0–46.0)
Hemoglobin: 10.2 g/dL — ABNORMAL LOW (ref 12.0–15.0)
MCH: 23.7 pg — AB (ref 26.0–34.0)
MCHC: 32.2 g/dL (ref 30.0–36.0)
MCV: 73.7 fL — AB (ref 78.0–100.0)
Platelets: 281 10*3/uL (ref 150–400)
RBC: 4.3 MIL/uL (ref 3.87–5.11)
RDW: 12.7 % (ref 11.5–15.5)
WBC: 7.5 10*3/uL (ref 4.0–10.5)

## 2013-04-02 LAB — BASIC METABOLIC PANEL
BUN: 6 mg/dL (ref 6–23)
CO2: 27 meq/L (ref 19–32)
CREATININE: 0.54 mg/dL (ref 0.50–1.10)
Calcium: 8.9 mg/dL (ref 8.4–10.5)
Chloride: 99 mEq/L (ref 96–112)
GFR calc Af Amer: >90 mL/min (ref >90)
GFR calc non Af Amer: >90 mL/min (ref >90)
Glucose, Bld: 90 mg/dL (ref 70–99)
Potassium: 3.7 mEq/L (ref 3.7–5.3)
Sodium: 136 mEq/L — ABNORMAL LOW (ref 137–147)

## 2013-04-02 MED ORDER — IBUPROFEN 800 MG PO TABS: 800.0000 mg | ORAL_TABLET | Freq: Three times a day (TID) | ORAL | Status: DC | PRN

## 2013-04-02 MED ORDER — ONDANSETRON 8 MG/NS 50 ML IVPB
8.0000 mg | Freq: Three times a day (TID) | INTRAVENOUS | Status: DC | PRN
  Administered 2013-04-02: 8 mg via INTRAVENOUS
  Filled 2013-04-02: qty 8

## 2013-04-02 MED ORDER — HYDROMORPHONE HCL 2 MG PO TABS
2.0000 mg | ORAL_TABLET | ORAL | Status: DC | PRN
  Administered 2013-04-02: 2 mg via ORAL

## 2013-04-02 MED ORDER — HYDROMORPHONE HCL 2 MG PO TABS
1.0000 mg | ORAL_TABLET | ORAL | Status: DC | PRN
  Administered 2013-04-02 (×2): 1 mg via ORAL
  Filled 2013-04-02 (×2): qty 1

## 2013-04-02 MED ORDER — KETOROLAC TROMETHAMINE 30 MG/ML IJ SOLN
30.0000 mg | Freq: Four times a day (QID) | INTRAMUSCULAR | Status: DC
  Administered 2013-04-02 (×2): 30 mg via INTRAVENOUS
  Filled 2013-04-02 (×2): qty 1

## 2013-04-02 MED ORDER — HYDROMORPHONE HCL 2 MG PO TABS
2.0000 mg | ORAL_TABLET | ORAL | Status: DC | PRN
  Filled 2013-04-02: qty 1

## 2013-04-02 MED ORDER — HYDROMORPHONE HCL 2 MG PO TABS: 2.0000 mg | ORAL_TABLET | Freq: Four times a day (QID) | ORAL | Status: DC | PRN

## 2013-04-02 NOTE — Progress Notes (Signed)
Pt discharged home with husband... Condition stable... No equipment... Ambulated to car with E. Caldwell, RN. 

## 2013-04-02 NOTE — Discharge Instructions (Signed)
Call [facility] @ [phone] if: ° °You have a temperature greater than or equal to 100.4 degrees Farenheit orally °You have pain that is not made better by the pain medication given and taken as directed °You have excessive bleeding or problems urinating ° °Take Colace (Docusate Sodium/Stool Softener) 100 mg 2-3 times daily while taking narcotic pain medicine to avoid constipation or until bowel movements are regular. °Take Ibuprofen 600 mg with food every 6 hours for 5 days then as needed for pain ° °You may drive after 1 week °You may walk up steps ° °You may shower  °You may resume a regular diet ° °Keep incisions clean and dry °Do not lift over 15 pounds for 6 weeks °Avoid anything in vagina for 6 weeks (or until after your post-operative visit) ° °

## 2013-04-02 NOTE — Progress Notes (Signed)
Chelsea Byrd is a37 y.o.  [id number]  Post Op Date # 1;  TLH/BS/Cystocopy  Subjective: Patient is progressing slowly with report of being dizzy last night when she tried to ambulate so is afraid to today. Patient has complaints of pain at supra pubic area and generalized pelvic discomfort., Tolerating liquids, hasn't passed flatus and Foley in place and still has PCA.  Objective: Vital signs in last 24 hours: Temp:  [97.4 F (36.3 C)-99.4 F (37.4 C)] 97.7 F (36.5 C) (01/13 0610) Pulse Rate:  [72-94] 75 (01/13 0610) Resp:  [8-20] 16 (01/13 0610) BP: (92-121)/(54-73) 102/64 mmHg (01/13 0610) SpO2:  [97 %-100 %] 100 % (01/13 0610) Weight:  [143 lb (64.864 kg)] 143 lb (64.864 kg) (01/12 1700)  Intake/Output from previous day: 01/12 0701 - 01/13 0700 In: 4405.2 [P.O.:340; I.V.:4065.2] Out: 2300 [Urine:2150] Intake/Output this shift:    Recent Labs Lab 03/29/13 0955  WBC 6.1  HGB 11.7*  HCT 36.6  PLT 265    No results found for this basename: NA, K, CL, CO2, BUN, CREATININE, CALCIUM, LABALBU, PROT, BILITOT, ALKPHOS, ALT, AST, GLUCOSE,  in the last 168 hours  EXAM: General: alert, fatigued and mild distress Resp: clear to auscultation bilaterally Cardio: regular rate and rhythm, S1, S2 normal, no murmur, click, rub or gallop GI: Soft with decreased bowels sounds; incisions without evidence of infection and are intact. Extremities: SCD hose in place and functioning; no calf tenderness.   Assessment: s/p Procedure(s): HYSTERECTOMY TOTAL LAPAROSCOPIC CYSTOSCOPY BILATERAL SALPINGECTOMY: stable  Plan: Advance diet Encourage ambulation Advance to PO medication CBC & BMET-pending D/C PCA and continue  IV Toradol and begin oral Dilaudid D/C Foley  LOS: 1 day    Leyton Brownlee, PA-C 04/02/2013 8:31 AM

## 2013-04-02 NOTE — Anesthesia Postprocedure Evaluation (Signed)
Anesthesia Post Note  Patient: Chelsea Byrd  Procedure(s) Performed: Procedure(s): HYSTERECTOMY TOTAL LAPAROSCOPIC (N/A) CYSTOSCOPY (Bilateral) BILATERAL SALPINGECTOMY (Bilateral)  Anesthesia type: General  Patient location: [facility]  Post pain: Pain level controlled  Post assessment: Post-op Vital signs reviewed  Last Vitals: BP 102/64  Pulse 75  Temp(Src) 36.5 C (Oral)  Resp 16  Ht 5\' 4"  (1.626 m)  Wt 143 lb (64.864 kg)  BMI 24.53 kg/m2  SpO2 100%  Post vital signs: Reviewed  Level of consciousness: awake  Complications: No apparent anesthesia complications

## 2013-05-07 ENCOUNTER — Other Ambulatory Visit: Payer: Self-pay | Admitting: Obstetrics and Gynecology

## 2013-05-07 DIAGNOSIS — R102 Pelvic and perineal pain: Secondary | ICD-10-CM

## 2013-05-08 ENCOUNTER — Ambulatory Visit (HOSPITAL_COMMUNITY)
Admission: RE | Admit: 2013-05-08 | Discharge: 2013-05-08 | Disposition: A | Discharge status: Home / Self Care | Payer: BC Managed Care – PPO | Source: Ambulatory Visit | Attending: Obstetrics and Gynecology | Admitting: Obstetrics and Gynecology

## 2013-05-08 DIAGNOSIS — R102 Pelvic and perineal pain: Secondary | ICD-10-CM

## 2013-05-08 DIAGNOSIS — N83209 Unspecified ovarian cyst, unspecified side: Secondary | ICD-10-CM | POA: Insufficient documentation

## 2013-05-08 DIAGNOSIS — Q676 Pectus excavatum: Secondary | ICD-10-CM | POA: Insufficient documentation

## 2013-05-08 DIAGNOSIS — N949 Unspecified condition associated with female genital organs and menstrual cycle: Secondary | ICD-10-CM | POA: Insufficient documentation

## 2013-05-08 DIAGNOSIS — Z90710 Acquired absence of both cervix and uterus: Secondary | ICD-10-CM | POA: Insufficient documentation

## 2013-05-08 DIAGNOSIS — R109 Unspecified abdominal pain: Secondary | ICD-10-CM | POA: Insufficient documentation

## 2013-05-08 MED ORDER — IOHEXOL 300 MG/ML  SOLN
100.0000 mL | Freq: Once | INTRAMUSCULAR | Status: AC | PRN
  Administered 2013-05-08: 100 mL via INTRAVENOUS

## 2013-05-10 ENCOUNTER — Inpatient Hospital Stay (HOSPITAL_COMMUNITY)
Admission: AD | Admit: 2013-05-10 | Discharge: 2013-05-10 | Disposition: A | Discharge status: Home / Self Care | Payer: BC Managed Care – PPO | Source: Ambulatory Visit | Attending: Obstetrics and Gynecology | Admitting: Obstetrics and Gynecology

## 2013-05-10 ENCOUNTER — Encounter (HOSPITAL_COMMUNITY): Payer: Self-pay | Admitting: General Practice

## 2013-05-10 DIAGNOSIS — N926 Irregular menstruation, unspecified: Secondary | ICD-10-CM | POA: Insufficient documentation

## 2013-05-10 DIAGNOSIS — F17200 Nicotine dependence, unspecified, uncomplicated: Secondary | ICD-10-CM | POA: Insufficient documentation

## 2013-05-10 DIAGNOSIS — R109 Unspecified abdominal pain: Secondary | ICD-10-CM | POA: Insufficient documentation

## 2013-05-10 DIAGNOSIS — M549 Dorsalgia, unspecified: Secondary | ICD-10-CM | POA: Insufficient documentation

## 2013-05-10 LAB — URINALYSIS, ROUTINE W REFLEX MICROSCOPIC
Bilirubin Urine: NEGATIVE
GLUCOSE, UA: NEGATIVE mg/dL
Ketones, ur: NEGATIVE mg/dL
Nitrite: NEGATIVE
PROTEIN: NEGATIVE mg/dL
Specific Gravity, Urine: <1.005 — ABNORMAL LOW (ref 1.005–1.030)
Urobilinogen, UA: 0.2 mg/dL (ref 0.0–1.0)
pH: 6 (ref 5.0–8.0)

## 2013-05-10 LAB — CBC WITH DIFFERENTIAL/PLATELET
BASOS PCT: 1 % (ref 0–1)
Basophils Absolute: 0 10*3/uL (ref 0.0–0.1)
EOS ABS: 0.1 10*3/uL (ref 0.0–0.7)
Eosinophils Relative: 1 % (ref 0–5)
HCT: 37.1 % (ref 36.0–46.0)
HEMOGLOBIN: 12.1 g/dL (ref 12.0–15.0)
Lymphocytes Relative: 21 % (ref 12–46)
Lymphs Abs: 1.6 10*3/uL (ref 0.7–4.0)
MCH: 24.2 pg — AB (ref 26.0–34.0)
MCHC: 32.6 g/dL (ref 30.0–36.0)
MCV: 74.1 fL — ABNORMAL LOW (ref 78.0–100.0)
MONOS PCT: 5 % (ref 3–12)
Monocytes Absolute: 0.4 10*3/uL (ref 0.1–1.0)
NEUTROS ABS: 5.6 10*3/uL (ref 1.7–7.7)
NEUTROS PCT: 73 % (ref 43–77)
Platelets: 243 10*3/uL (ref 150–400)
RBC: 5.01 MIL/uL (ref 3.87–5.11)
RDW: 14.1 % (ref 11.5–15.5)
WBC: 7.7 10*3/uL (ref 4.0–10.5)

## 2013-05-10 LAB — URINE MICROSCOPIC-ADD ON

## 2013-05-10 MED ORDER — CYCLOBENZAPRINE HCL 10 MG PO TABS: 10.0000 mg | ORAL_TABLET | Freq: Once | ORAL | Status: DC

## 2013-05-10 MED ORDER — KETOROLAC TROMETHAMINE 60 MG/2ML IM SOLN
60.0000 mg | Freq: Once | INTRAMUSCULAR | Status: AC
  Administered 2013-05-10: 60 mg via INTRAMUSCULAR
  Filled 2013-05-10: qty 2

## 2013-05-10 MED ORDER — CYCLOBENZAPRINE HCL 10 MG PO TABS: 10.0000 mg | ORAL_TABLET | Freq: Two times a day (BID) | ORAL | Status: DC | PRN

## 2013-05-10 NOTE — MAU Note (Signed)
Pt states had CT here two days ago, was told she has another cyst. Did not find uti two days ago. Back pain began yesterday.

## 2013-05-10 NOTE — Discharge Instructions (Signed)
Back Pain, Adult Low back pain is very common. About 1 in 5 people have back pain.The cause of low back pain is rarely dangerous. The pain often gets better over time.About half of people with a sudden onset of back pain feel better in just 2 weeks. About 8 in 10 people feel better by 6 weeks.  CAUSES Some common causes of back pain include:  Strain of the muscles or ligaments supporting the spine.  Wear and tear (degeneration) of the spinal discs.  Arthritis.  Direct injury to the back. DIAGNOSIS Most of the time, the direct cause of low back pain is not known.However, back pain can be treated effectively even when the exact cause of the pain is unknown.Answering your caregiver's questions about your overall health and symptoms is one of the most accurate ways to make sure the cause of your pain is not dangerous. If your caregiver needs more information, he or she may order lab work or imaging tests (X-rays or MRIs).However, even if imaging tests show changes in your back, this usually does not require surgery. HOME CARE INSTRUCTIONS For many people, back pain returns.Since low back pain is rarely dangerous, it is often a condition that people can learn to manageon their own.   Remain active. It is stressful on the back to sit or stand in one place. Do not sit, drive, or stand in one place for more than 30 minutes at a time. Take short walks on level surfaces as soon as pain allows.Try to increase the length of time you walk each day.  Do not stay in bed.Resting more than 1 or 2 days can delay your recovery.  Do not avoid exercise or work.Your body is made to move.It is not dangerous to be active, even though your back may hurt.Your back will likely heal faster if you return to being active before your pain is gone.  Pay attention to your body when you bend and lift. Many people have less discomfortwhen lifting if they bend their knees, keep the load close to their bodies,and  avoid twisting. Often, the most comfortable positions are those that put less stress on your recovering back.  Find a comfortable position to sleep. Use a firm mattress and lie on your side with your knees slightly bent. If you lie on your back, put a pillow under your knees.  Only take over-the-counter or prescription medicines as directed by your caregiver. Over-the-counter medicines to reduce pain and inflammation are often the most helpful.Your caregiver may prescribe muscle relaxant drugs.These medicines help dull your pain so you can more quickly return to your normal activities and healthy exercise.  Put ice on the injured area.  Put ice in a plastic bag.  Place a towel between your skin and the bag.  Leave the ice on for 15-20 minutes, 03-04 times a day for the first 2 to 3 days. After that, ice and heat may be alternated to reduce pain and spasms.  Ask your caregiver about trying back exercises and gentle massage. This may be of some benefit.  Avoid feeling anxious or stressed.Stress increases muscle tension and can worsen back pain.It is important to recognize when you are anxious or stressed and learn ways to manage it.Exercise is a great option. SEEK MEDICAL CARE IF:  You have pain that is not relieved with rest or medicine.  You have pain that does not improve in 1 week.  You have new symptoms.  You are generally not feeling well. SEEK   IMMEDIATE MEDICAL CARE IF:   You have pain that radiates from your back into your legs.  You develop new bowel or bladder control problems.  You have unusual weakness or numbness in your arms or legs.  You develop nausea or vomiting.  You develop abdominal pain.  You feel faint. Document Released: 03/07/2005 Document Revised: 09/06/2011 Document Reviewed: 07/26/2010 [employer] Patient Information 2014 [employer].  

## 2013-05-10 NOTE — MAU Note (Signed)
Pt presents to MAU with c/o severe back pain that started yesterday that she stated was pretty severe but states today is even worse. Pt had a hysterectomy on January 12th. Had a CT 2 days ago which showed a cyst she assumes is on her ovary. Pt states she does feel a little under the weather with a scratchy throat and runny nose but denies any fever.

## 2013-05-10 NOTE — MAU Provider Note (Signed)
History     CSN: [id number]  Arrival date and time: 05/10/13 1146   First Provider Initiated Contact with Patient 05/10/13 1350      Chief Complaint  Patient presents with  . Back Pain   HPI Comments: Pt is a 38yo white female presents to MAU w c/o bilateral low lateral back pain that is worsening since yesterday. Pt took 2 doses of 800mg  motrin without relief yesterday. Pt is s/p TLH/BS/cystoscopy approx 6 wks ago by Dr Su Hilt. Pt also c/o cramping, which started last week, pt seen in office on Tuesday, and UA was neg, although prior tx'd w keflex. Pt states she thinks it is a kidney infection, is also worried about the ovarian cyst noted on CT that was done Wednesday.  Pt denies fever, chills, N/V, has normal bowel function, denies dysuria. She does report some occ vaginal spotting.   Back Pain This is a new problem. The current episode started yesterday. The problem occurs constantly. The problem has been gradually worsening since onset. The pain is present in the sacro-iliac and lumbar spine. The pain does not radiate. The pain is at a severity of 9/10. The pain is severe. The symptoms are aggravated by position and standing. Associated symptoms include abdominal pain. Pertinent negatives include no chest pain, dysuria or fever. She has tried NSAIDs for the symptoms. The treatment provided no relief.      Past Medical History  Diagnosis Date  . History of varicella   . Low iron     history with pregnancy - resolved  . Yeast infection   . Abnormal Pap smear 5/ 2012  . Irregular periods/menstrual cycles   . Infections of kidney   . SVD (spontaneous vaginal delivery)     x 1    Past Surgical History  Procedure Laterality Date  . Dilation and curettage of uterus    . Laparoscopic tubal ligation  08/18/2011    Procedure: LAPAROSCOPIC TUBAL LIGATION;  Surgeon: Purcell Nails, MD;  Location: [facility];  Service: Gynecology;  Laterality: Bilateral;  poss ovarian cystectomy  .  Ovarian cyst removal  08/18/2011    Procedure: OVARIAN CYSTECTOMY;  Surgeon: Purcell Nails, MD;  Location: [facility];  Service: Gynecology;  Laterality: Left;  . Endometrial ablation    . Tubal ligation    . Diagnostic laparoscopy  07/2011  . Tooth extraction    . Laparoscopic hysterectomy N/A 04/01/2013    Procedure: HYSTERECTOMY TOTAL LAPAROSCOPIC;  Surgeon: Purcell Nails, MD;  Location: [facility];  Service: Gynecology;  Laterality: N/A;  . Cystoscopy Bilateral 04/01/2013    Procedure: CYSTOSCOPY;  Surgeon: Purcell Nails, MD;  Location: [facility];  Service: Gynecology;  Laterality: Bilateral;  . Bilateral salpingectomy Bilateral 04/01/2013    Procedure: BILATERAL SALPINGECTOMY;  Surgeon: Purcell Nails, MD;  Location: [facility];  Service: Gynecology;  Laterality: Bilateral;    Family History  Problem Relation Age of Onset  . Cancer Maternal Aunt     OVARIAN  . Diabetes Maternal Grandmother   . Cancer Maternal Grandfather   . Alzheimer's disease Maternal Grandfather     History  Substance Use Topics  . Smoking status: Former Smoker -- 1.00 packs/day for 20 years    Types: Cigarettes    Quit date: 07/30/2011  . Smokeless tobacco: Not on file  . Alcohol Use: No    Allergies: No Known Allergies  Prescriptions prior to admission  Medication Sig Dispense Refill  . ibuprofen (ADVIL,MOTRIN) 800 MG tablet  Take 1 tablet (800 mg total) by mouth every 8 (eight) hours as needed for moderate pain.  30 tablet  0  . penicillin v potassium (VEETID) 500 MG tablet Take 500 mg by mouth 4 (four) times daily.        Review of Systems  Constitutional: Negative for fever and chills.  Respiratory: Negative for shortness of breath.   Cardiovascular: Negative for chest pain.  Gastrointestinal: Positive for abdominal pain. Negative for nausea, vomiting, diarrhea and constipation.       Cramping, which comes and goes, has had about a week.   Genitourinary: Negative for dysuria, urgency, frequency and  flank pain.  Musculoskeletal: Positive for back pain.  Neurological: Negative for dizziness, sensory change and focal weakness.  All other systems reviewed and are negative.   Physical Exam   Blood pressure 135/87, pulse 88, temperature 98.5 F (36.9 C), temperature source Oral, resp. rate 18, height 5\' 4"  (1.626 m), weight 140 lb (63.504 kg), last menstrual period 08/03/2011, SpO2 100.00%.  Physical Exam  Nursing note and vitals reviewed. Constitutional: She is oriented to person, place, and time. She appears well-developed and well-nourished. No distress.  HENT:  Head: Normocephalic.  Eyes: Pupils are equal, round, and reactive to light.  Neck: Normal range of motion.  Cardiovascular: Normal rate, regular rhythm and normal heart sounds.   Respiratory: Effort normal and breath sounds normal.  GI: Soft. Bowel sounds are normal. She exhibits no distension and no mass. There is no tenderness. There is no rebound and no guarding.  Genitourinary: No vaginal discharge found.  Deferred   Musculoskeletal: Normal range of motion. She exhibits no edema and no tenderness.  Neg CVAT   Neurological: She is alert and oriented to person, place, and time. She has normal reflexes.  Skin: Skin is warm and dry.  Psychiatric: She has a normal mood and affect. Her behavior is normal.    MAU Course  Procedures  CBC UA   Assessment and Plan  38yo s/p TLH about 6wks ago Back pain unk etiology  Not likely pyelonephritis Could be musculoskeletal, or psychosomatic   CBC w diff ordered UA w lg hemoglobin, and leuk, micro essentially nl  D/w Dr Stefano GaulStringer toradol 60mg  IM ordered  Pt has f/u w Dr Su Hiltoberts on March 18th  LILLARD,SHELLEY M 05/10/2013, 2:09 PM   PROGRESS NOTE  I have reviewed the patient's vital signs, labs, and notes. I have examined the patient. I agree with the previous note from the Certified Nurse Midwife. I will prescribe Flexeril for back spasms.  Leonard SchwartzArthur Vernon Basilio Meadow,  M.D. 05/10/2013

## 2014-01-20 ENCOUNTER — Encounter (HOSPITAL_COMMUNITY): Payer: Self-pay | Admitting: General Practice

## 2014-01-30 ENCOUNTER — Other Ambulatory Visit (HOSPITAL_COMMUNITY): Payer: Self-pay | Admitting: Obstetrics and Gynecology

## 2014-01-30 DIAGNOSIS — R109 Unspecified abdominal pain: Secondary | ICD-10-CM

## 2014-01-30 DIAGNOSIS — R319 Hematuria, unspecified: Secondary | ICD-10-CM

## 2014-01-31 ENCOUNTER — Ambulatory Visit (HOSPITAL_COMMUNITY)
Admission: RE | Admit: 2014-01-31 | Discharge: 2014-01-31 | Disposition: A | Discharge status: Home / Self Care | Payer: BC Managed Care – PPO | Source: Ambulatory Visit | Attending: Obstetrics and Gynecology | Admitting: Obstetrics and Gynecology

## 2014-01-31 DIAGNOSIS — R319 Hematuria, unspecified: Secondary | ICD-10-CM

## 2014-01-31 DIAGNOSIS — R109 Unspecified abdominal pain: Secondary | ICD-10-CM

## 2014-02-04 ENCOUNTER — Ambulatory Visit (HOSPITAL_COMMUNITY)
Admission: RE | Admit: 2014-02-04 | Discharge: 2014-02-04 | Disposition: A | Discharge status: Home / Self Care | Payer: BC Managed Care – PPO | Source: Ambulatory Visit | Attending: Obstetrics and Gynecology | Admitting: Obstetrics and Gynecology

## 2014-02-04 ENCOUNTER — Encounter (HOSPITAL_COMMUNITY): Payer: Self-pay

## 2014-02-04 DIAGNOSIS — R319 Hematuria, unspecified: Secondary | ICD-10-CM | POA: Insufficient documentation

## 2014-02-04 DIAGNOSIS — N949 Unspecified condition associated with female genital organs and menstrual cycle: Secondary | ICD-10-CM | POA: Diagnosis not present

## 2014-02-04 DIAGNOSIS — Z90710 Acquired absence of both cervix and uterus: Secondary | ICD-10-CM | POA: Diagnosis not present

## 2014-02-04 DIAGNOSIS — R109 Unspecified abdominal pain: Secondary | ICD-10-CM | POA: Insufficient documentation

## 2014-02-04 DIAGNOSIS — N390 Urinary tract infection, site not specified: Secondary | ICD-10-CM | POA: Diagnosis not present

## 2014-02-04 DIAGNOSIS — K5730 Diverticulosis of large intestine without perforation or abscess without bleeding: Secondary | ICD-10-CM | POA: Insufficient documentation

## 2014-02-04 MED ORDER — IOHEXOL 300 MG/ML  SOLN
100.0000 mL | Freq: Once | INTRAMUSCULAR | Status: AC | PRN
  Administered 2014-02-04: 100 mL via INTRAVENOUS

## 2016-05-19 IMAGING — CT CT ABD-PEL WO/W CM
1 of 3 series · 13 of 32 positions shown, 19 images · IV contrast (omnipaque)
Comparison: 05/08/2013

CLINICAL DATA: Pelvic pain x2 months, bilateral flank pain for 1
week, hematuria, UTI. Prior hysterectomy.

EXAM:
CT ABDOMEN AND PELVIS WITHOUT AND WITH CONTRAST
TECHNIQUE: Multidetector CT imaging of the abdomen and pelvis was performed
following the standard protocol before and following the bolus
administration of intravenous contrast.
CONTRAST:  100mL OMNIPAQUE IOHEXOL 300 MG/ML  SOLN

[Series 4: routine abdomen/pelvis with · axial · 0.72mm/px · z∈[-439,-79]mm · 13 of 84 slices shown, 19 images]
[im 6/84  soft-tissue]
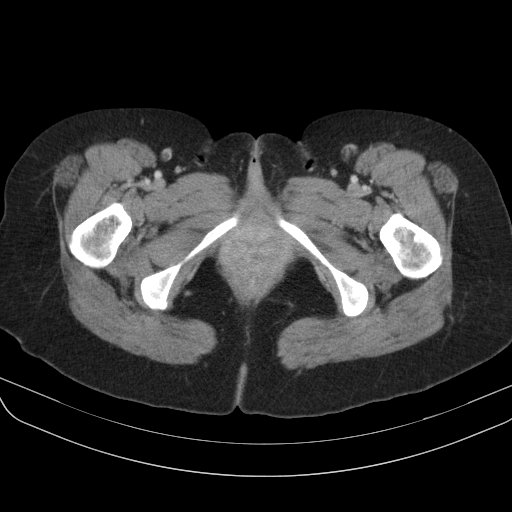
[im 6/84  bone]
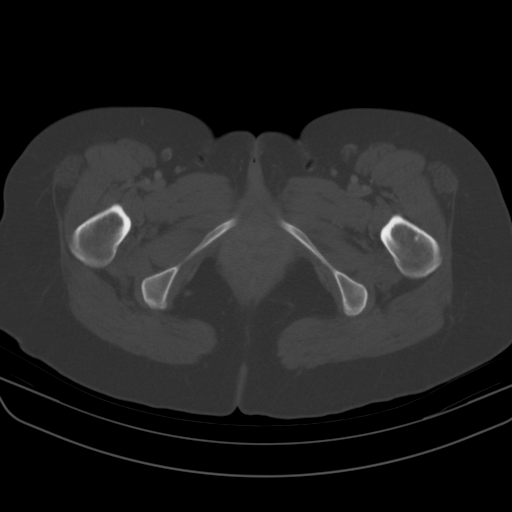
[im 12/84  soft-tissue]
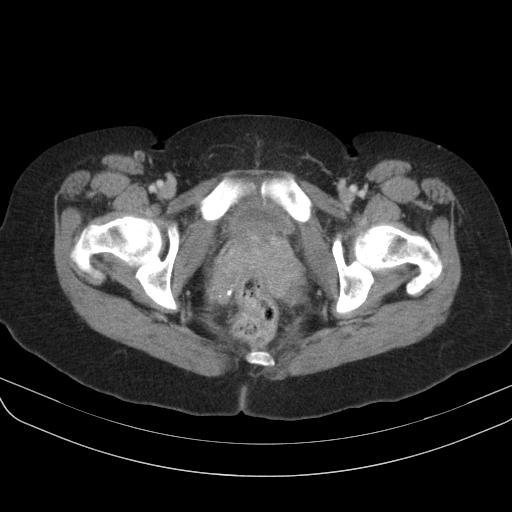
[im 17/84  soft-tissue]
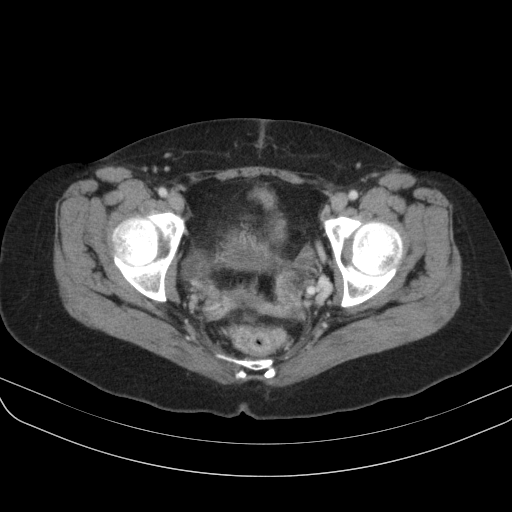
[im 23/84  soft-tissue]
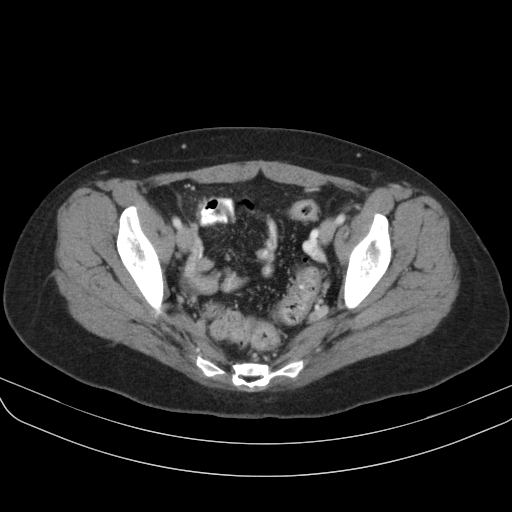
[im 28/84  soft-tissue]
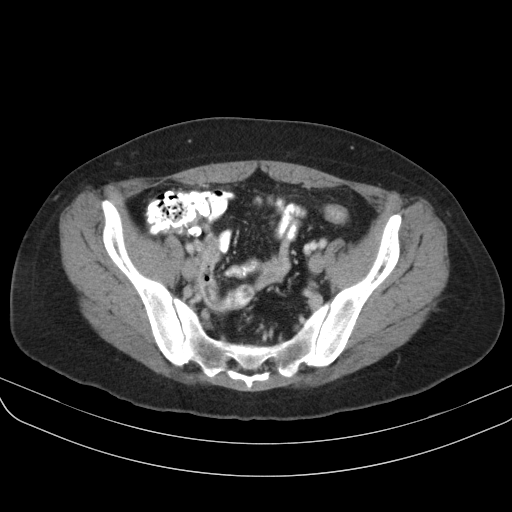
[im 34/84  soft-tissue]
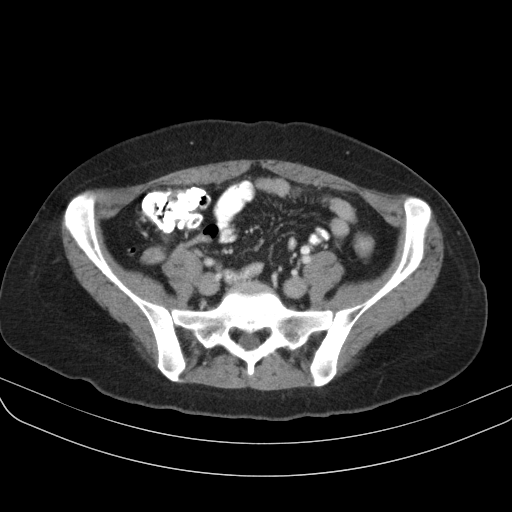
[im 45/84  soft-tissue]
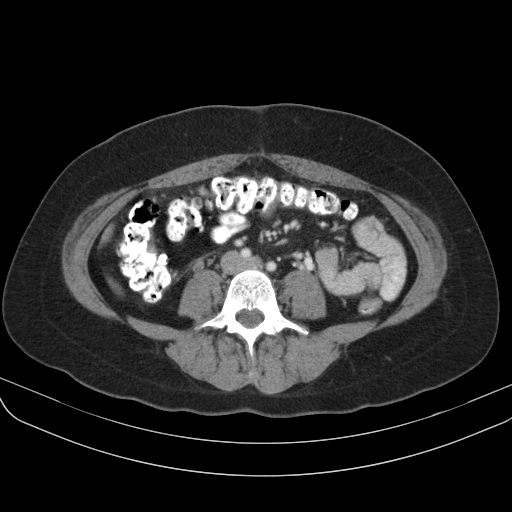
[im 50/84  soft-tissue]
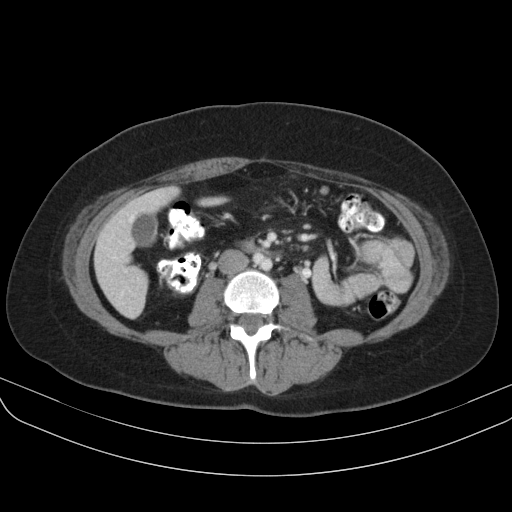
[im 56/84  soft-tissue]
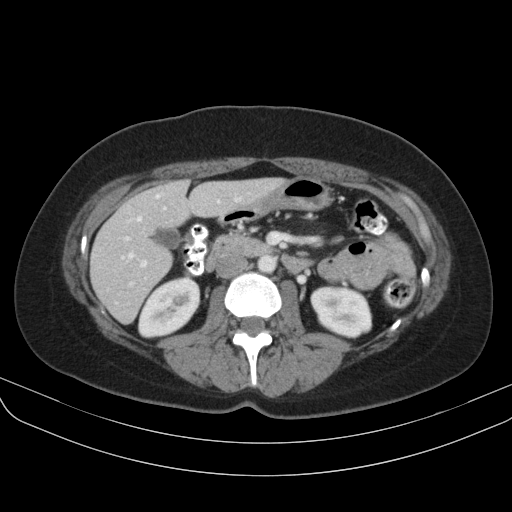
[im 56/84  bone]
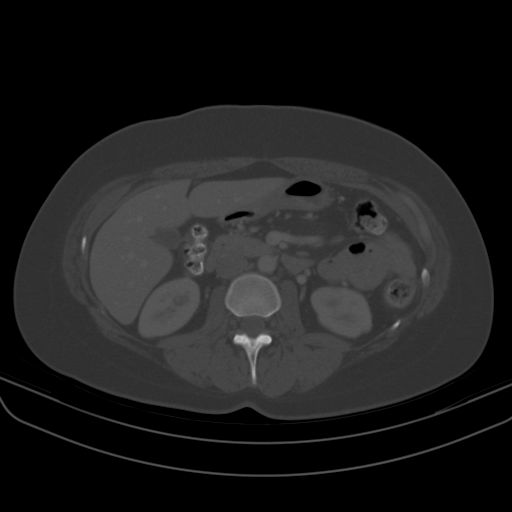
[im 61/84  soft-tissue]
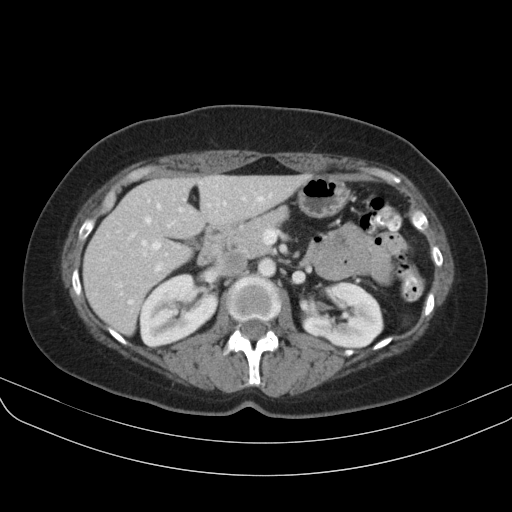
[im 61/84  lung]
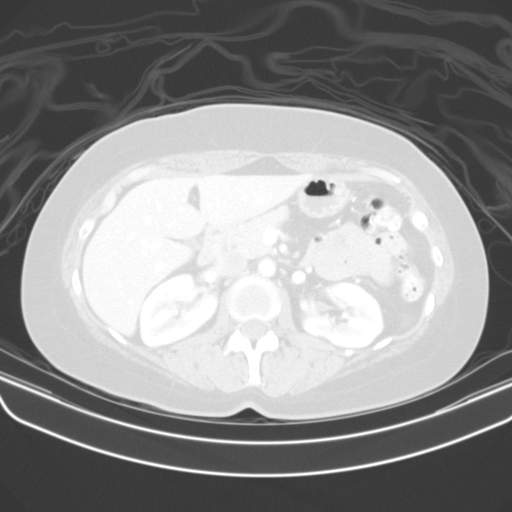
[im 67/84  soft-tissue]
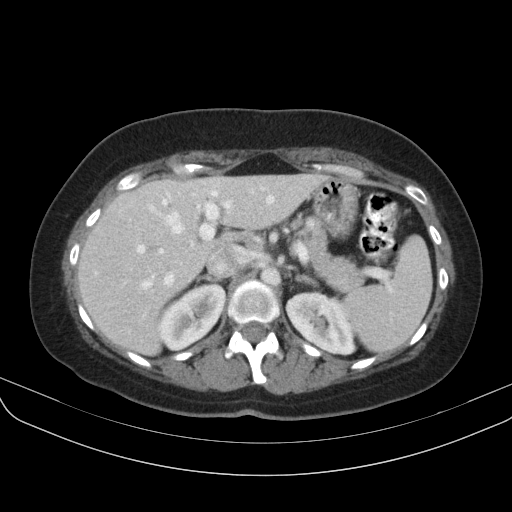
[im 67/84  lung]
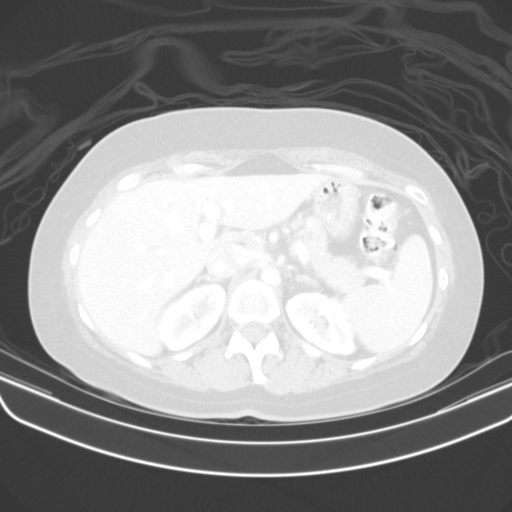
[im 72/84  soft-tissue]
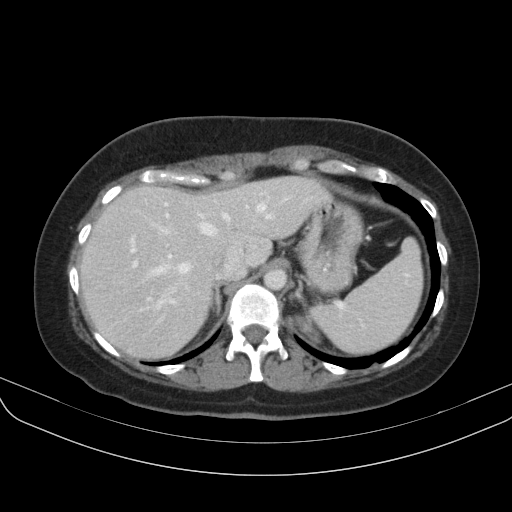
[im 72/84  lung]
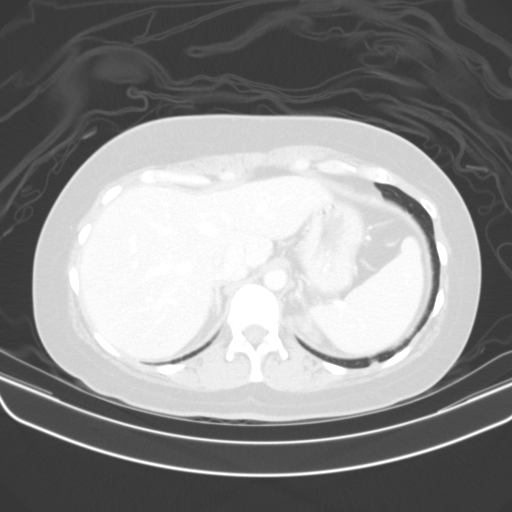
[im 78/84  soft-tissue]
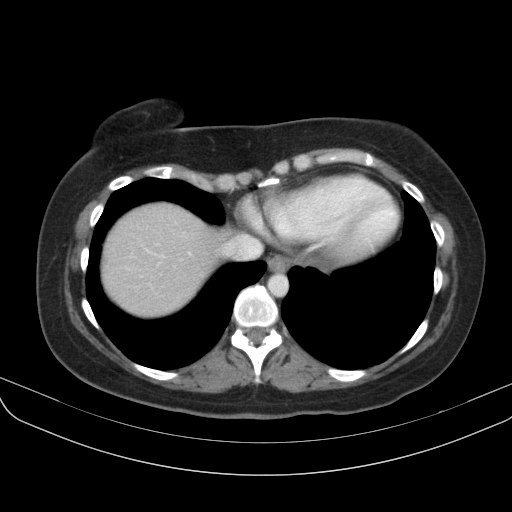
[im 78/84  lung]
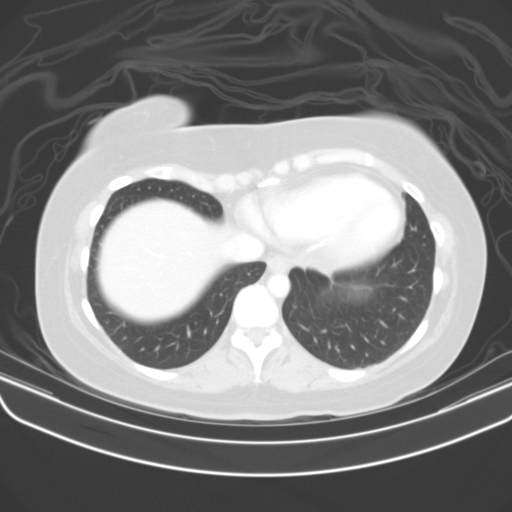

[13 of 32 positions shown; findings below may reference images not displayed]

FINDINGS: Lower chest:  Lung bases are clear.

Hepatobiliary: Liver is within normal limits.

Gallbladder is unremarkable. No intrahepatic or extrahepatic ductal
dilatation.

Pancreas: Within normal limits.

Spleen: Within normal limits.

Adrenals/Urinary Tract: Adrenal glands are unremarkable.

Kidneys are within normal limits.  No enhancing renal lesions.

No renal, ureteral, or bladder calculi.  No hydronephrosis.

Bladder is mildly thick-walled.

Stomach/Bowel: Stomach is is grossly unremarkable.

No evidence of bowel obstruction.

Normal appendix.

Colonic diverticulosis, without associated inflammatory changes.

Vascular/Lymphatic: No evidence of abdominal aortic aneurysm.

No suspicious abdominopelvic lymphadenopathy.

Reproductive: Status post hysterectomy.

Right ovary is notable for a 14 mm cyst/follicle, likely
physiologic. Left ovary is within normal limits.

Other: Minimal/trace pelvic fluid.

Musculoskeletal: Visualized osseous structures are within normal
limits.
IMPRESSION: Bladder wall thickening, correlate for cystitis.

No renal, ureteral, or bladder calculi.  No hydronephrosis.

No enhancing renal lesions.

## 2017-12-20 ENCOUNTER — Telehealth: Payer: Self-pay | Admitting: Psychiatry

## 2017-12-20 NOTE — Telephone Encounter (Signed)
Pt called and said that she has been on the medicine since last Thursday she is nauseaous and stomach hurting. Would like to change medicine.

## 2017-12-20 NOTE — Telephone Encounter (Signed)
Called pt and left voicemail to call back.

## 2017-12-21 NOTE — Telephone Encounter (Signed)
Information and chart given to provider. They will follow up.

## 2017-12-22 ENCOUNTER — Telehealth: Payer: Self-pay | Admitting: Psychiatry

## 2017-12-22 ENCOUNTER — Other Ambulatory Visit: Payer: Self-pay | Admitting: Psychiatry

## 2017-12-22 DIAGNOSIS — F329 Major depressive disorder, single episode, unspecified: Secondary | ICD-10-CM

## 2017-12-22 DIAGNOSIS — F32A Depression, unspecified: Secondary | ICD-10-CM

## 2017-12-22 MED ORDER — SERTRALINE HCL 25 MG PO TABS: ORAL_TABLET | ORAL | 0 refills | Status: DC

## 2017-12-22 NOTE — Telephone Encounter (Signed)
Patient has already weaned herself off Effexor.

## 2017-12-22 NOTE — Telephone Encounter (Signed)
Pt has a medication problem. Have Dr call her please.

## 2018-01-15 ENCOUNTER — Ambulatory Visit (INDEPENDENT_AMBULATORY_CARE_PROVIDER_SITE_OTHER): Payer: 59 | Admitting: Psychiatry

## 2018-01-15 DIAGNOSIS — F411 Generalized anxiety disorder: Secondary | ICD-10-CM | POA: Diagnosis not present

## 2018-01-15 DIAGNOSIS — F909 Attention-deficit hyperactivity disorder, unspecified type: Secondary | ICD-10-CM

## 2018-01-15 DIAGNOSIS — F329 Major depressive disorder, single episode, unspecified: Secondary | ICD-10-CM

## 2018-01-15 DIAGNOSIS — F32A Depression, unspecified: Secondary | ICD-10-CM

## 2018-01-15 MED ORDER — AMPHETAMINE-DEXTROAMPHETAMINE 10 MG PO TABS: 10.0000 mg | ORAL_TABLET | Freq: Two times a day (BID) | ORAL | 0 refills | Status: DC

## 2018-01-15 MED ORDER — ALPRAZOLAM 0.25 MG PO TABS: 0.2500 mg | ORAL_TABLET | Freq: Every evening | ORAL | 0 refills | Status: DC | PRN

## 2018-01-15 NOTE — Progress Notes (Signed)
[facility]  Patient ID: Chelsea Byrd,  MRN: [id number]  PCP: Patient, No Pcp Per  Date of Evaluation: 01/15/2018 Time spent:20 minutes  Chief Complaint:   HISTORY/CURRENT STATUS: HPI patient is a 42 year old white female last seen 12/14/2017.  At that time we increased her dose of Adderall as her current dose was not enough.  Since then she has been doing okay.  Individual Medical History/ Review of Systems: Changes? :No   Allergies: Patient has no known allergies.  Current Medications:  Current Outpatient Medications:  .  sertraline (ZOLOFT) 25 MG tablet, Take 1 tablet (25 mg total) by mouth daily for 7 days, THEN 2 tablets (50 mg total) daily., Disp: 67 tablet, Rfl: 0 .  ALPRAZolam (XANAX) 0.25 MG tablet, Take 1 tablet (0.25 mg total) by mouth at bedtime as needed for anxiety., Disp: 30 tablet, Rfl: 0 .  [START ON 03/12/2018] amphetamine-dextroamphetamine (ADDERALL) 10 MG tablet, Take 1 tablet (10 mg total) by mouth 2 (two) times daily with a meal., Disp: 60 tablet, Rfl: 0 .  cyclobenzaprine (FLEXERIL) 10 MG tablet, Take 1 tablet (10 mg total) by mouth once. (Patient not taking: Reported on 01/15/2018), Disp: 30 tablet, Rfl: 0 .  cyclobenzaprine (FLEXERIL) 10 MG tablet, Take 1 tablet (10 mg total) by mouth 2 (two) times daily as needed for muscle spasms. (Patient not taking: Reported on 01/15/2018), Disp: 30 tablet, Rfl: 1 .  ibuprofen (ADVIL,MOTRIN) 800 MG tablet, Take 1 tablet (800 mg total) by mouth every 8 (eight) hours as needed for moderate pain. (Patient not taking: Reported on 01/15/2018), Disp: 30 tablet, Rfl: 0 .  penicillin v potassium (VEETID) 500 MG tablet, Take 500 mg by mouth 4 (four) times daily., Disp: , Rfl:  Medication Side Effects: none  Family Medical/ Social History: Changes? no  MENTAL HEALTH EXAM:  Last menstrual period 08/03/2011.There is no height or weight on file to calculate BMI.  General Appearance: Casual  Eye Contact:  Good   Speech:  Normal Rate  Volume:  Normal  Mood:  Euthymic  Affect:  Appropriate  Thought Process:  Goal Directed  Orientation:  Full (Time, Place, and Person)  Thought Content: Logical   Suicidal Thoughts:  No  Homicidal Thoughts:  No  Memory:  normal  Judgement:  Good  Insight:  Good  Psychomotor Activity:  Normal  Concentration:  Concentration: Good  Recall:  Good  Fund of Knowledge: Good  Language: Good  Assets:  Desire for Improvement  ADL's:  Intact  Cognition: WNL  Prognosis:  Good    DIAGNOSES:    ICD-10-CM   1. Attention deficit hyperactivity disorder (ADHD), unspecified ADHD type F90.9   2. Anxiety state F41.1   3. Depression, unspecified depression type F32.9     Receiving Psychotherapy: No   RECOMMENDATIONS:   Patient is doing well her medicines include Adderall 10 mg twice daily Zoloft 50 mg daily Xanax 0.25 mg as needed and Ambien 10 mg at bedtime was added I had to handwrite the Ambien and Zoloft as it would not enter with computer. 6 weeks  Anne Fu, PA-C

## 2018-02-13 ENCOUNTER — Telehealth: Payer: Self-pay | Admitting: Psychiatry

## 2018-02-13 ENCOUNTER — Other Ambulatory Visit: Payer: Self-pay | Admitting: Psychiatry

## 2018-02-13 MED ORDER — AMPHETAMINE-DEXTROAMPHETAMINE 10 MG PO TABS: ORAL_TABLET | ORAL | 0 refills | Status: DC

## 2018-02-13 NOTE — Telephone Encounter (Signed)
Need Adderall refill. Stated script was only for 2mos instead of 3.

## 2018-02-13 NOTE — Telephone Encounter (Signed)
eprescribed adderall 10mg  bid, [facility]

## 2018-02-13 NOTE — Telephone Encounter (Signed)
Routed to provider

## 2018-04-13 ENCOUNTER — Ambulatory Visit: Payer: 59 | Admitting: Psychiatry

## 2018-04-13 DIAGNOSIS — F3289 Other specified depressive episodes: Secondary | ICD-10-CM | POA: Diagnosis not present

## 2018-04-13 DIAGNOSIS — F411 Generalized anxiety disorder: Secondary | ICD-10-CM

## 2018-04-13 DIAGNOSIS — F329 Major depressive disorder, single episode, unspecified: Secondary | ICD-10-CM

## 2018-04-13 DIAGNOSIS — F909 Attention-deficit hyperactivity disorder, unspecified type: Secondary | ICD-10-CM | POA: Insufficient documentation

## 2018-04-13 DIAGNOSIS — F32A Depression, unspecified: Secondary | ICD-10-CM | POA: Insufficient documentation

## 2018-04-13 MED ORDER — AMPHETAMINE-DEXTROAMPHETAMINE 10 MG PO TABS: 10.0000 mg | ORAL_TABLET | Freq: Two times a day (BID) | ORAL | 0 refills | Status: DC

## 2018-04-13 MED ORDER — ZOLPIDEM TARTRATE 10 MG PO TABS: ORAL_TABLET | ORAL | 3 refills | Status: DC

## 2018-04-13 MED ORDER — AMPHETAMINE-DEXTROAMPHETAMINE 10 MG PO TABS: ORAL_TABLET | ORAL | 0 refills | Status: DC

## 2018-04-13 MED ORDER — SERTRALINE HCL 50 MG PO TABS: 50.0000 mg | ORAL_TABLET | Freq: Every day | ORAL | 3 refills | Status: DC

## 2018-04-13 MED ORDER — ALPRAZOLAM 0.5 MG PO TABS: 0.5000 mg | ORAL_TABLET | Freq: Every evening | ORAL | 3 refills | Status: DC | PRN

## 2018-04-13 NOTE — Progress Notes (Signed)
[facility]  Patient ID: Chelsea Byrd,  MRN: [id number]  PCP: Patient, No Pcp Per  Date of Evaluation: 04/13/2018 Time spent:20 minutes  Chief Complaint:   HISTORY/CURRENT STATUS: HPI seen 1019.  That time we increased her Adderall to 10 mg twice daily.  Her diagnoses include ADHD depression and anxiety. We she is currently doing well.  Individual Medical History/ Review of Systems: Changes? :No   Allergies: Patient has no known allergies.  Current Medications:  Current Outpatient Medications:  .  ALPRAZolam (XANAX) 0.5 MG tablet, Take 1 tablet (0.5 mg total) by mouth at bedtime as needed for anxiety., Disp: 30 tablet, Rfl: 3 .  amphetamine-dextroamphetamine (ADDERALL) 10 MG tablet, bid, Disp: 60 tablet, Rfl: 0 .  [START ON 05/11/2018] amphetamine-dextroamphetamine (ADDERALL) 10 MG tablet, Take 1 tablet (10 mg total) by mouth 2 (two) times daily with a meal., Disp: 60 tablet, Rfl: 0 .  [START ON 06/08/2018] amphetamine-dextroamphetamine (ADDERALL) 10 MG tablet, Take 1 tablet (10 mg total) by mouth 2 (two) times daily with a meal., Disp: 60 tablet, Rfl: 0 .  amphetamine-dextroamphetamine (ADDERALL) 10 MG tablet, Take 1 tablet (10 mg total) by mouth 2 (two) times daily with a meal., Disp: 60 tablet, Rfl: 0 .  cyclobenzaprine (FLEXERIL) 10 MG tablet, Take 1 tablet (10 mg total) by mouth once. (Patient not taking: Reported on 01/15/2018), Disp: 30 tablet, Rfl: 0 .  cyclobenzaprine (FLEXERIL) 10 MG tablet, Take 1 tablet (10 mg total) by mouth 2 (two) times daily as needed for muscle spasms. (Patient not taking: Reported on 01/15/2018), Disp: 30 tablet, Rfl: 1 .  ibuprofen (ADVIL,MOTRIN) 800 MG tablet, Take 1 tablet (800 mg total) by mouth every 8 (eight) hours as needed for moderate pain. (Patient not taking: Reported on 01/15/2018), Disp: 30 tablet, Rfl: 0 .  penicillin v potassium (VEETID) 500 MG tablet, Take 500 mg by mouth 4 (four) times daily., Disp: , Rfl:  .  sertraline  (ZOLOFT) 50 MG tablet, Take 1 tablet (50 mg total) by mouth daily., Disp: 30 tablet, Rfl: 3 .  zolpidem (AMBIEN) 10 MG tablet, 1 hs, Disp: 30 tablet, Rfl: 3 Medication Side Effects: none  Family Medical/ Social History: Changes? No  MENTAL HEALTH EXAM:  Last menstrual period 08/03/2011.There is no height or weight on file to calculate BMI.  General Appearance: Casual  Eye Contact:  Good  Speech:  Normal Rate  Volume:  Normal  Mood:  Euthymic  Affect:  Appropriate  Thought Process:  Linear  Orientation:  Full (Time, Place, and Person)  Thought Content: normal  Suicidal Thoughts:  No  Homicidal Thoughts:  No  Memory:  WNL  Judgement:  Good  Insight:  Good  Psychomotor Activity:  Normal  Concentration:  Concentration: Good  Recall:  Good  Fund of Knowledge: Good  Language: Good  Assets:  Resilience  ADL's:  Intact  Cognition: WNL  Prognosis:  Good    DIAGNOSES:    ICD-10-CM   1. Anxiety state F41.1   2. Attention deficit hyperactivity disorder (ADHD), unspecified ADHD type F90.9   3. Other depression F32.89   4. Depression, unspecified depression type F32.9     Receiving Psychotherapy: No    RECOMMENDATIONS: Patient is doing well we will keep the same medications Adderall 10 mg twice daily Zoloft 50 mg a day Xanax 0.5 mg a day as needed and Ambien 10 mg at bedtime.  I will see her again in  4 months   Anne Fu, [place]

## 2018-04-22 ENCOUNTER — Other Ambulatory Visit: Payer: Self-pay | Admitting: Psychiatry

## 2018-06-11 ENCOUNTER — Telehealth: Payer: Self-pay | Admitting: Psychiatry

## 2018-06-11 NOTE — Telephone Encounter (Signed)
Patient left vm 03/21 @11 :27 pm would like an increase on zoloft and anxiety medication please call if you have questions

## 2018-06-11 NOTE — Telephone Encounter (Signed)
For anxiety okay to increase sertraline to 100 mg daily or 2 of the 50 mg tablets.  Because she is taking a stimulant we cannot increase the Xanax.  It is inappropriate to take sedatives like Xanax with the stimulant.  Chelsea Byrd please inform the patient of this.

## 2018-06-12 ENCOUNTER — Telehealth: Payer: Self-pay | Admitting: Psychiatry

## 2018-06-12 NOTE — Telephone Encounter (Signed)
PT was given instructions.

## 2018-06-12 NOTE — Telephone Encounter (Signed)
Left voicemail to discuss Dr. Alwyn Ren recommendation.

## 2018-07-20 ENCOUNTER — Other Ambulatory Visit: Payer: Self-pay

## 2018-07-20 ENCOUNTER — Ambulatory Visit: Payer: 59 | Admitting: Physician Assistant

## 2018-07-20 ENCOUNTER — Encounter: Payer: Self-pay | Admitting: Physician Assistant

## 2018-07-20 DIAGNOSIS — F909 Attention-deficit hyperactivity disorder, unspecified type: Secondary | ICD-10-CM

## 2018-07-20 DIAGNOSIS — F411 Generalized anxiety disorder: Secondary | ICD-10-CM

## 2018-07-20 DIAGNOSIS — F331 Major depressive disorder, recurrent, moderate: Secondary | ICD-10-CM

## 2018-07-20 DIAGNOSIS — G4700 Insomnia, unspecified: Secondary | ICD-10-CM

## 2018-07-20 MED ORDER — ALPRAZOLAM 0.5 MG PO TABS: 0.5000 mg | ORAL_TABLET | Freq: Every evening | ORAL | 3 refills | Status: DC | PRN

## 2018-07-20 MED ORDER — SERTRALINE HCL 100 MG PO TABS: 100.0000 mg | ORAL_TABLET | Freq: Every day | ORAL | 0 refills | Status: DC

## 2018-07-20 MED ORDER — AMPHETAMINE-DEXTROAMPHETAMINE 10 MG PO TABS: 10.0000 mg | ORAL_TABLET | Freq: Two times a day (BID) | ORAL | 0 refills | Status: DC

## 2018-07-20 MED ORDER — ZALEPLON 10 MG PO CAPS: ORAL_CAPSULE | ORAL | 1 refills | Status: DC

## 2018-07-20 NOTE — Progress Notes (Signed)
[facility]  Patient ID: Chelsea Byrd,  MRN: [id number]  PCP: Patient, No Pcp Per  Date of Evaluation: 07/20/2018 Time spent:15 minutes  Chief Complaint:  Chief Complaint    Follow-up     Virtual Visit via Telephone Note  I connected with patient by a video enabled telemedicine application or telephone, with their informed consent, and verified patient privacy and that I am speaking with the correct person using two identifiers.  I am private, in my home and the patient is home.   I discussed the limitations, risks, security and privacy concerns of performing an evaluation and management service by telephone and the availability of in person appointments. I also discussed with the patient that there may be a patient responsible charge related to this service. The patient expressed understanding and agreed to proceed.   I discussed the assessment and treatment plan with the patient. The patient was provided an opportunity to ask questions and all were answered. The patient agreed with the plan and demonstrated an understanding of the instructions.   The patient was advised to call back or seek an in-person evaluation if the symptoms worsen or if the condition fails to improve as anticipated.  I provided 15 minutes of non-face-to-face time during this encounter.  HISTORY/CURRENT STATUS: HPI For routine med check.  Patient will now be seeing me, transferring care due to the unfortunate loss of my colleague Chelsea Byrd, [place] who she used to see.  Doing well overall. Meds are working.  She is very happy that she and Chelsea Byrd found this cocktail.    She mostly takes the Xanax in the evening to help her relax to go to sleep.  The Ambien does help her sleep but if she wakes up, which she does several nights a week and does have trouble going back to sleep, she experiences some amnesia.  She would sort of like to try something else, she states.  Patient denies loss of interest in  usual activities and is able to enjoy things.  Denies decreased energy or motivation.  Appetite has not changed.  No extreme sadness, tearfulness, or feelings of hopelessness.  Denies any changes in concentration, making decisions or remembering things.  Denies suicidal or homicidal thoughts.  Recently, the Zoloft had been increased from 50 to 100 mg daily which has been very helpful.  Patient denies increased energy with decreased need for sleep, no increased talkativeness, no racing thoughts, no impulsivity or risky behaviors, no increased spending, no increased libido, no grandiosity.  States that attention is good without easy distractibility.  Able to focus on things and finish tasks to completion.   Denies muscle or joint pain, stiffness, or dystonia.  Denies dizziness, syncope, seizures, numbness, tingling, tremor, tics, unsteady gait, slurred speech, confusion.   Individual Medical History/ Review of Systems: Changes? :No    Past medications for mental health diagnoses include: Effexor, Latuda, Ambien  Allergies: Patient has no known allergies.  Current Medications:  Current Outpatient Medications:  .  ALPRAZolam (XANAX) 0.5 MG tablet, Take 1 tablet (0.5 mg total) by mouth at bedtime as needed for anxiety., Disp: 30 tablet, Rfl: 3 .  amphetamine-dextroamphetamine (ADDERALL) 10 MG tablet, Take 1 tablet (10 mg total) by mouth 2 (two) times daily with a meal., Disp: 60 tablet, Rfl: 0 .  amphetamine-dextroamphetamine (ADDERALL) 10 MG tablet, Take 1 tablet (10 mg total) by mouth 2 (two) times daily with a meal., Disp: 60 tablet, Rfl: 0 .  [START ON 10/05/2018] amphetamine-dextroamphetamine (  ADDERALL) 10 MG tablet, Take 1 tablet (10 mg total) by mouth 2 (two) times daily with a meal., Disp: 60 tablet, Rfl: 0 .  [START ON 08/07/2018] amphetamine-dextroamphetamine (ADDERALL) 10 MG tablet, Take 1 tablet (10 mg total) by mouth 2 (two) times daily with a meal. bid, Disp: 60 tablet, Rfl: 0 .   [START ON 09/06/2018] amphetamine-dextroamphetamine (ADDERALL) 10 MG tablet, Take 1 tablet (10 mg total) by mouth 2 (two) times daily with a meal., Disp: 60 tablet, Rfl: 0 .  cyclobenzaprine (FLEXERIL) 10 MG tablet, Take 1 tablet (10 mg total) by mouth once. (Patient not taking: Reported on 01/15/2018), Disp: 30 tablet, Rfl: 0 .  cyclobenzaprine (FLEXERIL) 10 MG tablet, Take 1 tablet (10 mg total) by mouth 2 (two) times daily as needed for muscle spasms. (Patient not taking: Reported on 01/15/2018), Disp: 30 tablet, Rfl: 1 .  penicillin v potassium (VEETID) 500 MG tablet, Take 500 mg by mouth 4 (four) times daily., Disp: , Rfl:  .  sertraline (ZOLOFT) 100 MG tablet, Take 1 tablet (100 mg total) by mouth daily., Disp: 90 tablet, Rfl: 0 .  zaleplon (SONATA) 10 MG capsule, 1 qs prn and mayb repeat for MNA w/ 3 hours left to sleep, Disp: 60 capsule, Rfl: 1 Medication Side Effects: none  Family Medical/ Social History: Changes? Yes increased stressors due to coronavirus pandemic and shelter in place.  She works at [employer] so cannot not work  MENTAL HEALTH EXAM:  Last menstrual period 08/03/2011.There is no height or weight on file to calculate BMI.  General Appearance: unable to assess  Eye Contact:  unable to assess  Speech:  Clear and Coherent  Volume:  Normal  Mood:  Euthymic  Affect:  unable to assess  Thought Process:  Goal Directed  Orientation:  Full (Time, Place, and Person)  Thought Content: Logical   Suicidal Thoughts:  No  Homicidal Thoughts:  No  Memory:  WNL  Judgement:  Good  Insight:  Good  Psychomotor Activity:  unable to assess  Concentration:  Concentration: Good and Attention Span: Good  Recall:  Good  Fund of Knowledge: Good  Language: Good  Assets:  Desire for Improvement  ADL's:  Intact  Cognition: WNL  Prognosis:  Good    DIAGNOSES:    ICD-10-CM   1. Major depressive disorder, recurrent episode, moderate (HCC) F33.1   2. Attention deficit hyperactivity disorder  (ADHD), unspecified ADHD type F90.9   3. Generalized anxiety disorder F41.1   4. Insomnia, unspecified type G47.00     Receiving Psychotherapy: No    RECOMMENDATIONS: We discussed the fact that generally I do not prescribe benzos and stimulants at the same time.  With everything that is going on, I do not believe now is the time to address that.  However we will need to in the future.  It seems that she is using the Xanax to help her relax to go to sleep anyway and hopefully the changes we are making today will decrease the need for the Xanax.  She verbalizes understanding. DC Ambien. Start Sonata 10 mg p.o. nightly as needed sleep and may repeat 1 for mid nocturnal awakening as needed as long as she has 3 hours left to sleep.  We also discussed sleep hygiene. Continue Adderall 10 mg 1 p.o. twice daily.  PDMP was reviewed. New Xanax 0.5 mg nightly as needed sleep for anxiety. Continue Zoloft 100 mg daily.  Melony Overlyeresa Hughes Wyndham, PA-C   This record has been created using [employer]Dragon  software.  Chart creation errors have been sought, but may not always have been located and corrected. Such creation errors do not reflect on the standard of medical care.

## 2018-07-30 ENCOUNTER — Telehealth: Payer: Self-pay

## 2018-07-30 NOTE — Telephone Encounter (Signed)
Prior authorization submitted for Zaleplon 10 mg through [facility] [employer] approved through 07/19/2021

## 2018-09-13 ENCOUNTER — Telehealth: Payer: Self-pay | Admitting: Physician Assistant

## 2018-09-13 ENCOUNTER — Other Ambulatory Visit: Payer: Self-pay | Admitting: Physician Assistant

## 2018-09-13 MED ORDER — ZOLPIDEM TARTRATE 10 MG PO TABS: 10.0000 mg | ORAL_TABLET | Freq: Every evening | ORAL | 0 refills | Status: DC | PRN

## 2018-09-13 NOTE — Telephone Encounter (Signed)
Rx sent in

## 2018-09-13 NOTE — Progress Notes (Signed)
Rx sent in for Ambien per phone note.

## 2018-09-13 NOTE — Telephone Encounter (Signed)
Patient called and said that the sonata is not working and would like to go back on [place] but [facility] [address]

## 2018-10-12 ENCOUNTER — Other Ambulatory Visit: Payer: Self-pay | Admitting: Physician Assistant

## 2018-10-21 ENCOUNTER — Other Ambulatory Visit: Payer: Self-pay | Admitting: Physician Assistant

## 2018-10-22 NOTE — Telephone Encounter (Signed)
Last fill 07/18, looks like she's only getting #15 at a time. May be do to insurance.

## 2018-11-15 ENCOUNTER — Telehealth: Payer: Self-pay | Admitting: Physician Assistant

## 2018-11-15 ENCOUNTER — Other Ambulatory Visit: Payer: Self-pay

## 2018-11-15 MED ORDER — ZOLPIDEM TARTRATE 10 MG PO TABS: 10.0000 mg | ORAL_TABLET | Freq: Every evening | ORAL | 1 refills | Status: DC | PRN

## 2018-11-15 MED ORDER — ALPRAZOLAM 0.5 MG PO TABS: 0.5000 mg | ORAL_TABLET | Freq: Every evening | ORAL | 0 refills | Status: DC | PRN

## 2018-11-15 MED ORDER — AMPHETAMINE-DEXTROAMPHETAMINE 10 MG PO TABS: 10.0000 mg | ORAL_TABLET | Freq: Two times a day (BID) | ORAL | 0 refills | Status: DC

## 2018-11-15 NOTE — Telephone Encounter (Signed)
Last refill for zolpidem #15 on 10/31/2018 Last refill for alprazolam 0.5 mg #30 on 10/12/2018 Last refill for adderall 10 mg on 10/05/2018  Will pend for approval

## 2018-11-15 NOTE — Telephone Encounter (Signed)
Patient had to reschedule need refills on Adderall, Xanax and Ambien has enough of Sertraline until October, [facility] [address].

## 2018-11-16 ENCOUNTER — Ambulatory Visit: Payer: 59 | Admitting: Physician Assistant

## 2018-12-21 ENCOUNTER — Ambulatory Visit (INDEPENDENT_AMBULATORY_CARE_PROVIDER_SITE_OTHER): Payer: 59 | Admitting: Physician Assistant

## 2018-12-21 ENCOUNTER — Encounter: Payer: Self-pay | Admitting: Physician Assistant

## 2018-12-21 ENCOUNTER — Other Ambulatory Visit: Payer: Self-pay

## 2018-12-21 DIAGNOSIS — G4700 Insomnia, unspecified: Secondary | ICD-10-CM

## 2018-12-21 DIAGNOSIS — F329 Major depressive disorder, single episode, unspecified: Secondary | ICD-10-CM | POA: Diagnosis not present

## 2018-12-21 DIAGNOSIS — F411 Generalized anxiety disorder: Secondary | ICD-10-CM | POA: Diagnosis not present

## 2018-12-21 DIAGNOSIS — F32A Depression, unspecified: Secondary | ICD-10-CM

## 2018-12-21 DIAGNOSIS — F909 Attention-deficit hyperactivity disorder, unspecified type: Secondary | ICD-10-CM

## 2018-12-21 MED ORDER — AMPHETAMINE-DEXTROAMPHETAMINE 10 MG PO TABS: 10.0000 mg | ORAL_TABLET | Freq: Two times a day (BID) | ORAL | 0 refills | Status: DC

## 2018-12-21 MED ORDER — ZOLPIDEM TARTRATE ER 12.5 MG PO TBCR: 12.5000 mg | EXTENDED_RELEASE_TABLET | Freq: Every evening | ORAL | 0 refills | Status: DC | PRN

## 2018-12-21 MED ORDER — SERTRALINE HCL 100 MG PO TABS: 100.0000 mg | ORAL_TABLET | Freq: Every day | ORAL | 1 refills | Status: DC

## 2018-12-21 NOTE — Progress Notes (Signed)
[facility]  Patient ID: Chelsea Byrd,  MRN: [id number]  PCP: Patient, No Pcp Per  Date of Evaluation: 12/21/2018 Time spent:15 minutes  Chief Complaint:  Chief Complaint    Insomnia; Depression; Anxiety; Follow-up     Virtual Visit via Telephone Note  I connected with patient by a video enabled telemedicine application or telephone, with their informed consent, and verified patient privacy and that I am speaking with the correct person using two identifiers.  I am private, in my office and the patient is home.  I discussed the limitations, risks, security and privacy concerns of performing an evaluation and management service by telephone and the availability of in person appointments. I also discussed with the patient that there may be a patient responsible charge related to this service. The patient expressed understanding and agreed to proceed.   I discussed the assessment and treatment plan with the patient. The patient was provided an opportunity to ask questions and all were answered. The patient agreed with the plan and demonstrated an understanding of the instructions.   The patient was advised to call back or seek an in-person evaluation if the symptoms worsen or if the condition fails to improve as anticipated.  I provided 15 minutes of non-face-to-face time during this encounter.  HISTORY/CURRENT STATUS: HPI For routine med check.  Her biggest problem right now is not sleeping well.  She goes to sleep okay but is having trouble staying asleep.  At least half of the nights are this way.  She may only sleep about 4 hours or less and then when she wakes up, she cannot go back to sleep.  She has tried several different medications for this, melatonin, and sleep hygiene, without relief.  Her pharmacist suggested Ambien CR to see if that would help.  The Adderall is still effective.  She is able to focus and finish things after she starts them.  No side effects  that she is aware of.  She does take the Xanax but not daily.  It does help when she needs it to calm down.  She is not having panic attacks better and overall generalized anxiety at times.  The Zoloft has helped that a lot.  Patient denies loss of interest in usual activities and is able to enjoy things.  Denies decreased energy or motivation.  Appetite has not changed.  No extreme sadness, tearfulness, or feelings of hopelessness.  Denies any changes in concentration, making decisions or remembering things.  Denies suicidal or homicidal thoughts.  Denies dizziness, syncope, seizures, numbness, tingling, tremor, tics, unsteady gait, slurred speech, confusion. Denies muscle or joint pain, stiffness, or dystonia.  Individual Medical History/ Review of Systems: Changes? :No    Past medications for mental health diagnoses include: Effexor, Latuda, Ambien, Sonata wasn't effective  Allergies: Patient has no known allergies.  Current Medications:  Current Outpatient Medications:  .  ALPRAZolam (XANAX) 0.5 MG tablet, Take 1 tablet (0.5 mg total) by mouth at bedtime as needed for anxiety., Disp: 30 tablet, Rfl: 0 .  amphetamine-dextroamphetamine (ADDERALL) 10 MG tablet, Take 1 tablet (10 mg total) by mouth 2 (two) times daily with a meal., Disp: 60 tablet, Rfl: 0 .  amphetamine-dextroamphetamine (ADDERALL) 10 MG tablet, Take 1 tablet (10 mg total) by mouth 2 (two) times daily with a meal., Disp: 60 tablet, Rfl: 0 .  [START ON 02/17/2019] amphetamine-dextroamphetamine (ADDERALL) 10 MG tablet, Take 1 tablet (10 mg total) by mouth 2 (two) times daily with a meal., Disp:  60 tablet, Rfl: 0 .  [START ON 01/19/2019] amphetamine-dextroamphetamine (ADDERALL) 10 MG tablet, Take 1 tablet (10 mg total) by mouth 2 (two) times daily with a meal. bid, Disp: 60 tablet, Rfl: 0 .  amphetamine-dextroamphetamine (ADDERALL) 10 MG tablet, Take 1 tablet (10 mg total) by mouth 2 (two) times daily with a meal., Disp: 60  tablet, Rfl: 0 .  sertraline (ZOLOFT) 100 MG tablet, Take 1 tablet (100 mg total) by mouth daily., Disp: 90 tablet, Rfl: 1 .  cyclobenzaprine (FLEXERIL) 10 MG tablet, Take 1 tablet (10 mg total) by mouth once. (Patient not taking: Reported on 12/21/2018), Disp: 30 tablet, Rfl: 0 .  cyclobenzaprine (FLEXERIL) 10 MG tablet, Take 1 tablet (10 mg total) by mouth 2 (two) times daily as needed for muscle spasms. (Patient not taking: Reported on 01/15/2018), Disp: 30 tablet, Rfl: 1 .  penicillin v potassium (VEETID) 500 MG tablet, Take 500 mg by mouth 4 (four) times daily., Disp: , Rfl:  .  zolpidem (AMBIEN CR) 12.5 MG CR tablet, Take 1 tablet (12.5 mg total) by mouth at bedtime as needed for sleep., Disp: 30 tablet, Rfl: 0 Medication Side Effects: none  Family Medical/ Social History: Changes? No  MENTAL HEALTH EXAM:  Last menstrual period 08/03/2011.There is no height or weight on file to calculate BMI.  General Appearance: Unable to assess  Eye Contact:  Unable to assess  Speech:  Clear and Coherent  Volume:  Decreased  Mood:  Euthymic  Affect:  Unable to assess  Thought Process:  Goal Directed and Descriptions of Associations: Intact  Orientation:  Full (Time, Place, and Person)  Thought Content: Logical   Suicidal Thoughts:  No  Homicidal Thoughts:  No  Memory:  WNL  Judgement:  Good  Insight:  Good  Psychomotor Activity:  Unable to assess  Concentration:  Concentration: Good  Recall:  Good  Fund of Knowledge: Good  Language: Good  Assets:  Desire for Improvement  ADL's:  Intact  Cognition: WNL  Prognosis:  Good    DIAGNOSES:    ICD-10-CM   1. Insomnia, unspecified type  G47.00   2. Attention deficit hyperactivity disorder (ADHD), unspecified ADHD type  F90.9   3. Depression, unspecified depression type  F32.9   4. Generalized anxiety disorder  F41.1     Receiving Psychotherapy: No    RECOMMENDATIONS:  Discontinue Ambien.   Error: I stated it was discontinued due to an  allergic reaction.  This is not true.  It is for a change in therapy. Start Ambien CR 12.5 mg nightly as needed. Continue Zoloft 100 mg daily. Continue Adderall 10 mg twice daily. Continue Xanax 0.5 mg nightly as needed sleep. Sleep hygiene discussed. Return in 3 months.  Donnal Moat, PA-C

## 2019-02-13 ENCOUNTER — Other Ambulatory Visit: Payer: Self-pay | Admitting: Physician Assistant

## 2019-04-17 ENCOUNTER — Telehealth: Payer: Self-pay | Admitting: Physician Assistant

## 2019-04-17 ENCOUNTER — Other Ambulatory Visit: Payer: Self-pay

## 2019-04-17 MED ORDER — AMPHETAMINE-DEXTROAMPHETAMINE 10 MG PO TABS: 10.0000 mg | ORAL_TABLET | Freq: Two times a day (BID) | ORAL | 0 refills | Status: DC

## 2019-04-17 NOTE — Telephone Encounter (Signed)
Last refill 12/29, pended for Dr. Jennelle Human to submit

## 2019-04-17 NOTE — Telephone Encounter (Signed)
Pt request refill for [facility]derall @ [facility] [address] [place]. Next appt 1/29

## 2019-04-19 ENCOUNTER — Encounter: Payer: Self-pay | Admitting: Physician Assistant

## 2019-04-19 ENCOUNTER — Ambulatory Visit (INDEPENDENT_AMBULATORY_CARE_PROVIDER_SITE_OTHER): Payer: No Typology Code available for payment source | Admitting: Physician Assistant

## 2019-04-19 DIAGNOSIS — G4700 Insomnia, unspecified: Secondary | ICD-10-CM

## 2019-04-19 DIAGNOSIS — F909 Attention-deficit hyperactivity disorder, unspecified type: Secondary | ICD-10-CM

## 2019-04-19 DIAGNOSIS — F329 Major depressive disorder, single episode, unspecified: Secondary | ICD-10-CM | POA: Diagnosis not present

## 2019-04-19 DIAGNOSIS — F411 Generalized anxiety disorder: Secondary | ICD-10-CM

## 2019-04-19 DIAGNOSIS — F32A Depression, unspecified: Secondary | ICD-10-CM

## 2019-04-19 MED ORDER — AMPHETAMINE-DEXTROAMPHETAMINE 10 MG PO TABS: 10.0000 mg | ORAL_TABLET | Freq: Two times a day (BID) | ORAL | 0 refills | Status: DC

## 2019-04-19 NOTE — Progress Notes (Signed)
[facility]  Patient ID: Chelsea Byrd,  MRN: [id number]  PCP: Patient, No Pcp Per  Date of Evaluation: 04/19/2019 Time spent:20 minutes  Chief Complaint:  Chief Complaint    ADD; Insomnia; Follow-up     Virtual Visit via Telephone Note  I connected with patient by a video enabled telemedicine application or telephone, with their informed consent, and verified patient privacy and that I am speaking with the correct person using two identifiers.  I am private, in my office and the patient is at work.  I discussed the limitations, risks, security and privacy concerns of performing an evaluation and management service by telephone and the availability of in person appointments. I also discussed with the patient that there may be a patient responsible charge related to this service. The patient expressed understanding and agreed to proceed.   I discussed the assessment and treatment plan with the patient. The patient was provided an opportunity to ask questions and all were answered. The patient agreed with the plan and demonstrated an understanding of the instructions.   The patient was advised to call back or seek an in-person evaluation if the symptoms worsen or if the condition fails to improve as anticipated.  I provided 20 minutes of non-face-to-face time during this encounter.  HISTORY/CURRENT STATUS: HPI for routine med check.  Patient states that she is doing much better as far as her sleep goes.  She had taken Xanax in the past to help relax and fall asleep and that has been effective this time since we started it back at the last visit.  It has been more helpful than Ambien.  She only takes the Xanax in the evening, approximately 6 to 8 hours from the time she takes her last Adderall.  States that attention is good without easy distractibility.  Able to focus on things and finish tasks to completion.   Patient denies loss of interest in usual activities and is  able to enjoy things.  Denies decreased energy or motivation.  Appetite has not changed.  No extreme sadness, tearfulness, or feelings of hopelessness.  Denies any changes in concentration, making decisions or remembering things.  Denies suicidal or homicidal thoughts.  Denies dizziness, syncope, seizures, numbness, tingling, tremor, tics, unsteady gait, slurred speech, confusion. Denies muscle or joint pain, stiffness, or dystonia.  Individual Medical History/ Review of Systems: Changes? :No    Past medications for mental health diagnoses include: Effexor, Latuda, Ambien, Sonata wasn't effective  Allergies: Patient has no known allergies.  Current Medications:  Current Outpatient Medications:  .  ALPRAZolam (XANAX) 0.5 MG tablet, TAKE 1 TABLET (0.5 MG TOTAL) BY MOUTH AT BEDTIME AS NEEDED FOR ANXIETY., Disp: 30 tablet, Rfl: 2 .  amphetamine-dextroamphetamine (ADDERALL) 10 MG tablet, Take 1 tablet (10 mg total) by mouth 2 (two) times daily with a meal., Disp: 60 tablet, Rfl: 0 .  [START ON 07/13/2019] amphetamine-dextroamphetamine (ADDERALL) 10 MG tablet, Take 1 tablet (10 mg total) by mouth 2 (two) times daily with a meal., Disp: 60 tablet, Rfl: 0 .  [START ON 06/13/2019] amphetamine-dextroamphetamine (ADDERALL) 10 MG tablet, Take 1 tablet (10 mg total) by mouth 2 (two) times daily with a meal., Disp: 60 tablet, Rfl: 0 .  [START ON 05/17/2019] amphetamine-dextroamphetamine (ADDERALL) 10 MG tablet, Take 1 tablet (10 mg total) by mouth 2 (two) times daily with a meal., Disp: 60 tablet, Rfl: 0 .  sertraline (ZOLOFT) 100 MG tablet, Take 1 tablet (100 mg total) by mouth daily., Disp: 90  tablet, Rfl: 1 .  amphetamine-dextroamphetamine (ADDERALL) 10 MG tablet, Take 1 tablet (10 mg total) by mouth 2 (two) times daily with a meal. bid, Disp: 60 tablet, Rfl: 0 .  cyclobenzaprine (FLEXERIL) 10 MG tablet, Take 1 tablet (10 mg total) by mouth once. (Patient not taking: Reported on 12/21/2018), Disp: 30 tablet,  Rfl: 0 .  cyclobenzaprine (FLEXERIL) 10 MG tablet, Take 1 tablet (10 mg total) by mouth 2 (two) times daily as needed for muscle spasms. (Patient not taking: Reported on 01/15/2018), Disp: 30 tablet, Rfl: 1 .  penicillin v potassium (VEETID) 500 MG tablet, Take 500 mg by mouth 4 (four) times daily., Disp: , Rfl:  Medication Side Effects: none  Family Medical/ Social History: Changes? No  MENTAL HEALTH EXAM:  Last menstrual period 08/03/2011.There is no height or weight on file to calculate BMI.  General Appearance: Unable to assess  Eye Contact:  Unable to assess  Speech:  Clear and Coherent  Volume:  Normal  Mood:  Euthymic  Affect:  Unable to assess  Thought Process:  Goal Directed and Descriptions of Associations: Intact  Orientation:  Full (Time, Place, and Person)  Thought Content: Logical   Suicidal Thoughts:  No  Homicidal Thoughts:  No  Memory:  WNL  Judgement:  Good  Insight:  Good  Psychomotor Activity:  Unable to assess  Concentration:  Concentration: Good and Attention Span: Good  Recall:  Good  Fund of Knowledge: Good  Language: Good  Assets:  Desire for Improvement  ADL's:  Intact  Cognition: WNL  Prognosis:  Good    DIAGNOSES:    ICD-10-CM   1. Insomnia, unspecified type  G47.00   2. Attention deficit hyperactivity disorder (ADHD), unspecified ADHD type  F90.9   3. Generalized anxiety disorder  F41.1   4. Depression, unspecified depression type  F32.9     Receiving Psychotherapy: No    RECOMMENDATIONS:  PDMP was reviewed. I spent 20 minutes with her. I am glad she is doing better on the current regimen of medications.  Continue Zoloft 100 mg, 1 p.o. daily. Continue Adderall 10 mg 1 every morning and 1 q. afternoon. Continue Xanax 0.5 mg 1 nightly as needed anxiety or sleep. Return in 6 months.  Melony Overly, PA-C

## 2019-05-21 ENCOUNTER — Other Ambulatory Visit: Payer: Self-pay | Admitting: Physician Assistant

## 2019-05-22 NOTE — Telephone Encounter (Signed)
Last apt 03/2019 

## 2019-06-27 ENCOUNTER — Other Ambulatory Visit: Payer: Self-pay | Admitting: Physician Assistant

## 2019-08-14 ENCOUNTER — Telehealth: Payer: Self-pay | Admitting: Physician Assistant

## 2019-08-14 ENCOUNTER — Other Ambulatory Visit: Payer: Self-pay

## 2019-08-14 MED ORDER — AMPHETAMINE-DEXTROAMPHETAMINE 10 MG PO TABS: 10.0000 mg | ORAL_TABLET | Freq: Two times a day (BID) | ORAL | 0 refills | Status: DC

## 2019-08-14 NOTE — Telephone Encounter (Signed)
Last refill 07/14/2019, pended for Dr. Jennelle Human to submit while Rosey Bath out of office

## 2019-08-14 NOTE — Telephone Encounter (Signed)
Pt requesting a refill on her Adderall. [facility] [address]. Last appt was a telehealth on 1/29 no follow scheduled.

## 2019-09-11 ENCOUNTER — Other Ambulatory Visit: Payer: Self-pay | Admitting: Physician Assistant

## 2019-09-11 NOTE — Telephone Encounter (Signed)
Last apt 04/19/2019 due back 6 months

## 2019-09-24 ENCOUNTER — Telehealth: Payer: Self-pay | Admitting: Physician Assistant

## 2019-09-24 NOTE — Telephone Encounter (Signed)
Pt requesting a refill on her Adderall. [facility] [address]. Last appt 1/29.

## 2019-09-25 ENCOUNTER — Other Ambulatory Visit: Payer: Self-pay

## 2019-09-25 MED ORDER — AMPHETAMINE-DEXTROAMPHETAMINE 10 MG PO TABS: 10.0000 mg | ORAL_TABLET | Freq: Two times a day (BID) | ORAL | 0 refills | Status: DC

## 2019-09-25 NOTE — Telephone Encounter (Signed)
Pt called 3  Times for Adderall refill @ [facility] [address]. Will check schedule @ work and call for follow up apt due 7/29

## 2019-09-25 NOTE — Telephone Encounter (Signed)
Last refill 08/14/2019 Last apt 04/19/2019 due back at 6 months, nothing scheduled at this time. Will pend for Rosey Bath to review and send if appropriate.

## 2019-11-04 ENCOUNTER — Telehealth: Payer: Self-pay | Admitting: Physician Assistant

## 2019-11-04 NOTE — Telephone Encounter (Signed)
Chelsea Byrd made appt 11/27/19.  [facility] in [facility] [address] near [facility] ( new pharmacy).

## 2019-11-05 ENCOUNTER — Other Ambulatory Visit: Payer: Self-pay

## 2019-11-05 MED ORDER — AMPHETAMINE-DEXTROAMPHETAMINE 10 MG PO TABS: 10.0000 mg | ORAL_TABLET | Freq: Two times a day (BID) | ORAL | 0 refills | Status: DC

## 2019-11-05 NOTE — Telephone Encounter (Signed)
If her prescription for Adderall is going to be sent today, she request that [facility] [address] [facility] [address].

## 2019-11-05 NOTE — Telephone Encounter (Signed)
Pended last refill 09/25/2019 Has upcoming apt 11/28/19

## 2019-11-07 ENCOUNTER — Other Ambulatory Visit: Payer: Self-pay | Admitting: Physician Assistant

## 2019-11-08 NOTE — Telephone Encounter (Signed)
Next apt 09/09 

## 2019-11-28 ENCOUNTER — Ambulatory Visit (INDEPENDENT_AMBULATORY_CARE_PROVIDER_SITE_OTHER): Payer: No Typology Code available for payment source | Admitting: Physician Assistant

## 2019-11-28 ENCOUNTER — Other Ambulatory Visit: Payer: Self-pay

## 2019-11-28 ENCOUNTER — Encounter: Payer: Self-pay | Admitting: Physician Assistant

## 2019-11-28 VITALS — BP 132/89 | HR 89

## 2019-11-28 DIAGNOSIS — F411 Generalized anxiety disorder: Secondary | ICD-10-CM

## 2019-11-28 DIAGNOSIS — F329 Major depressive disorder, single episode, unspecified: Secondary | ICD-10-CM

## 2019-11-28 DIAGNOSIS — F32A Depression, unspecified: Secondary | ICD-10-CM

## 2019-11-28 DIAGNOSIS — F909 Attention-deficit hyperactivity disorder, unspecified type: Secondary | ICD-10-CM | POA: Diagnosis not present

## 2019-11-28 MED ORDER — AMPHETAMINE-DEXTROAMPHETAMINE 10 MG PO TABS: 10.0000 mg | ORAL_TABLET | Freq: Three times a day (TID) | ORAL | 0 refills | Status: DC | PRN

## 2019-11-28 NOTE — Progress Notes (Signed)
[facility]  Patient ID: Chelsea Byrd,  MRN: [id number]  PCP: Patient, No Pcp Per  Date of Evaluation: 11/28/2019 Time spent:20 minutes  Chief Complaint:  Chief Complaint    ADD; Anxiety; Depression      HISTORY/CURRENT STATUS: HPI for routine med check.  Only issue is that the Adderall isn't working long enough.  Is working longer hours, and by the end of the day, her focus has dropped a lot.  She is much more easily distracted.  States it is almost like she is not taking anything.  When she does take the Adderall in the morning it is helpful for a few hours and the same pattern occurs when she takes the early afternoon dose.  Patient denies loss of interest in usual activities and is able to enjoy things.  Denies decreased energy or motivation.  Appetite has not changed.  No extreme sadness, tearfulness, or feelings of hopelessness.  Denies any changes in concentration, making decisions or remembering things. She continues to have anxiety but rarely uses the Xanax. Denies suicidal or homicidal thoughts.  Patient denies increased energy with decreased need for sleep, no increased talkativeness, no racing thoughts, no impulsivity or risky behaviors, no increased spending, no increased libido, no grandiosity, no increased irritability or anger, and no hallucinations.  Denies dizziness, syncope, seizures, numbness, tingling, tremor, tics, unsteady gait, slurred speech, confusion. Denies muscle or joint pain, stiffness, or dystonia.  Individual Medical History/ Review of Systems: Changes? :No    Past medications for mental health diagnoses include: Effexor, Latuda, Ambien, Sonata wasn't effective  Allergies: Patient has no known allergies.  Current Medications:  Current Outpatient Medications:  .  ALPRAZolam (XANAX) 0.5 MG tablet, TAKE 1 TABLET BY MOUTH EVERYDAY AT BEDTIME, Disp: 30 tablet, Rfl: 1 .  amphetamine-dextroamphetamine (ADDERALL) 10 MG tablet, Take 1 tablet  (10 mg total) by mouth 2 (two) times daily with a meal., Disp: 60 tablet, Rfl: 0 .  amphetamine-dextroamphetamine (ADDERALL) 10 MG tablet, Take 1 tablet (10 mg total) by mouth 2 (two) times daily with a meal. bid, Disp: 60 tablet, Rfl: 0 .  amphetamine-dextroamphetamine (ADDERALL) 10 MG tablet, Take 1 tablet (10 mg total) by mouth 2 (two) times daily with a meal., Disp: 60 tablet, Rfl: 0 .  amphetamine-dextroamphetamine (ADDERALL) 10 MG tablet, Take 1 tablet (10 mg total) by mouth 2 (two) times daily with a meal., Disp: 60 tablet, Rfl: 0 .  amphetamine-dextroamphetamine (ADDERALL) 10 MG tablet, Take 1 tablet (10 mg total) by mouth 3 (three) times daily as needed. Breakfast. Lunch, and late afternoon prn., Disp: 90 tablet, Rfl: 0 .  sertraline (ZOLOFT) 100 MG tablet, TAKE 1 TABLET BY MOUTH EVERY DAY, Disp: 90 tablet, Rfl: 1 .  penicillin v potassium (VEETID) 500 MG tablet, Take 500 mg by mouth 4 (four) times daily., Disp: , Rfl:  Medication Side Effects: none  Family Medical/ Social History: Changes? No  MENTAL HEALTH EXAM:  Blood pressure 132/89, pulse 89, last menstrual period 08/03/2011.There is no height or weight on file to calculate BMI.  General Appearance: Casual, Neat and Well Groomed  Eye Contact:  Good  Speech:  Clear and Coherent and Normal Rate  Volume:  Normal  Mood:  Euthymic  Affect:  Appropriate  Thought Process:  Goal Directed and Descriptions of Associations: Intact  Orientation:  Full (Time, Place, and Person)  Thought Content: Logical   Suicidal Thoughts:  No  Homicidal Thoughts:  No  Memory:  WNL  Judgement:  Good  Insight:  Good  Psychomotor Activity:  Normal  Concentration:  Concentration: Fair and Attention Span: Fair  Recall:  Good  Fund of Knowledge: Good  Language: Good  Assets:  Desire for Improvement  ADL's:  Intact  Cognition: WNL  Prognosis:  Good    DIAGNOSES:    ICD-10-CM   1. Attention deficit hyperactivity disorder (ADHD), unspecified ADHD  type  F90.9   2. Generalized anxiety disorder  F41.1   3. Depression, unspecified depression type  F32.9     Receiving Psychotherapy: No    RECOMMENDATIONS:  PDMP was reviewed. I provided 20 minutes of face to face time during this encounter.  We discussed increasing the dose of the Adderall.  I will give her an extra pill to take per day as needed.  She knows not to take it too late in the evening or else we will keep her awake.  She will call in approximately 3 weeks to let me know if this dose is effective.  If it is, then I will send in 3 more prescriptions.  If not, I will make changes as appropriate. Continue Zoloft 100 mg, 1 p.o. daily. Increase Adderall 10 mg 1 every morning, 1 po at lunch and 1 po late afternoon.  Continue Xanax 0.5 mg 1 nightly as needed anxiety or sleep. She understands this is not a common practice but since she's only using it at night for sleep. Return in 6 months.   Melony Overly, PA-C

## 2020-01-01 ENCOUNTER — Telehealth: Payer: Self-pay | Admitting: Physician Assistant

## 2020-01-01 ENCOUNTER — Other Ambulatory Visit: Payer: Self-pay

## 2020-01-01 MED ORDER — AMPHETAMINE-DEXTROAMPHETAMINE 10 MG PO TABS: 10.0000 mg | ORAL_TABLET | Freq: Three times a day (TID) | ORAL | 0 refills | Status: DC | PRN

## 2020-01-01 NOTE — Telephone Encounter (Signed)
Last refill 09/13 Pended for [employer]

## 2020-01-01 NOTE — Telephone Encounter (Signed)
Pt left a message saying that the dose of the adderall is good and that she would like [facility] [address]. Phone number [phone] in [place]

## 2020-01-01 NOTE — Telephone Encounter (Signed)
Yes please

## 2020-02-05 ENCOUNTER — Telehealth: Payer: Self-pay | Admitting: Physician Assistant

## 2020-02-05 NOTE — Telephone Encounter (Signed)
Chelsea Byrd called and LM that she needs a refill of her Adderall.  Appt 05/27/20.  [facility]

## 2020-02-06 ENCOUNTER — Other Ambulatory Visit: Payer: Self-pay

## 2020-02-06 MED ORDER — AMPHETAMINE-DEXTROAMPHETAMINE 10 MG PO TABS: 10.0000 mg | ORAL_TABLET | Freq: Three times a day (TID) | ORAL | 0 refills | Status: DC | PRN

## 2020-02-06 NOTE — Telephone Encounter (Signed)
Last refill 01/01/20 Pended for Rosey Bath to send

## 2020-03-09 ENCOUNTER — Telehealth: Payer: Self-pay | Admitting: Physician Assistant

## 2020-03-09 ENCOUNTER — Other Ambulatory Visit: Payer: Self-pay | Admitting: Physician Assistant

## 2020-03-09 MED ORDER — ALPRAZOLAM 0.5 MG PO TABS: ORAL_TABLET | ORAL | 1 refills | Status: DC

## 2020-03-09 MED ORDER — AMPHETAMINE-DEXTROAMPHETAMINE 10 MG PO TABS: 10.0000 mg | ORAL_TABLET | Freq: Three times a day (TID) | ORAL | 0 refills | Status: DC

## 2020-03-09 MED ORDER — SERTRALINE HCL 100 MG PO TABS: 100.0000 mg | ORAL_TABLET | Freq: Every day | ORAL | 3 refills | Status: DC

## 2020-03-09 NOTE — Telephone Encounter (Signed)
Prescriptions were sent

## 2020-03-09 NOTE — Telephone Encounter (Signed)
Pt called and left a message stating that she needs a refill on her adderall sertraline and [facility]ax [facility] [address]

## 2020-05-06 ENCOUNTER — Other Ambulatory Visit: Payer: Self-pay | Admitting: Physician Assistant

## 2020-05-27 ENCOUNTER — Telehealth (INDEPENDENT_AMBULATORY_CARE_PROVIDER_SITE_OTHER): Payer: No Typology Code available for payment source | Admitting: Physician Assistant

## 2020-05-27 ENCOUNTER — Encounter: Payer: Self-pay | Admitting: Physician Assistant

## 2020-05-27 DIAGNOSIS — G4700 Insomnia, unspecified: Secondary | ICD-10-CM

## 2020-05-27 DIAGNOSIS — F909 Attention-deficit hyperactivity disorder, unspecified type: Secondary | ICD-10-CM

## 2020-05-27 DIAGNOSIS — F411 Generalized anxiety disorder: Secondary | ICD-10-CM | POA: Diagnosis not present

## 2020-05-27 DIAGNOSIS — F3342 Major depressive disorder, recurrent, in full remission: Secondary | ICD-10-CM | POA: Diagnosis not present

## 2020-05-27 MED ORDER — ALPRAZOLAM 0.5 MG PO TABS: 0.5000 mg | ORAL_TABLET | Freq: Every evening | ORAL | 5 refills | Status: DC | PRN

## 2020-05-27 MED ORDER — AMPHETAMINE-DEXTROAMPHETAMINE 10 MG PO TABS: 10.0000 mg | ORAL_TABLET | Freq: Three times a day (TID) | ORAL | 0 refills | Status: DC

## 2020-05-27 NOTE — Progress Notes (Signed)
[facility]  Patient ID: Chelsea Byrd,  MRN: [id number]  PCP: Patient, No Pcp Per  Date of Evaluation: 05/27/2020 Time spent:25 minutes  Chief Complaint:  Chief Complaint    Depression; ADD; Anxiety     Virtual Visit via Telehealth  I connected with patient by telephone, with their informed consent, and verified patient privacy and that I am speaking with the correct person using two identifiers.  I am private, in my office and the patient is at work.  I discussed the limitations, risks, security and privacy concerns of performing an evaluation and management service by telephone and the availability of in person appointments. I also discussed with the patient that there may be a patient responsible charge related to this service. The patient expressed understanding and agreed to proceed.   I discussed the assessment and treatment plan with the patient. The patient was provided an opportunity to ask questions and all were answered. The patient agreed with the plan and demonstrated an understanding of the instructions.   The patient was advised to call back or seek an in-person evaluation if the symptoms worsen or if the condition fails to improve as anticipated.  I provided 25 minutes of non-face-to-face time during this encounter.   HISTORY/CURRENT STATUS: For routine med check.  Got a promotion at work.  Still working at [employer] and has been there around 25 years.  She is out of the house that she and her husband had together.  Completely separated now.  She is so much happier.  She is able to enjoy things.  Denies decreased energy or motivation.  Appetite has not changed.  No extreme sadness, tearfulness, or feelings of hopelessness.  Able to focus and get things done in a timely manner.  Takes Xanax in the evening only to help her go to sleep.  She never takes it during the day.  Denies suicidal or homicidal thoughts.  Patient denies increased energy with  decreased need for sleep, no increased talkativeness, no racing thoughts, no impulsivity or risky behaviors, no increased spending, no increased libido, no grandiosity, no increased irritability or anger, and no hallucinations.  Denies dizziness, syncope, seizures, numbness, tingling, tremor, tics, unsteady gait, slurred speech, confusion. Denies muscle or joint pain, stiffness, or dystonia.  Individual Medical History/ Review of Systems: Changes? :No    Past medications for mental health diagnoses include: Effexor, Latuda, Ambien, Sonata wasn't effective  Allergies: Patient has no known allergies.  Current Medications:  Current Outpatient Medications:  .  sertraline (ZOLOFT) 100 MG tablet, Take 1 tablet (100 mg total) by mouth daily., Disp: 90 tablet, Rfl: 3 .  ALPRAZolam (XANAX) 0.5 MG tablet, Take 1 tablet (0.5 mg total) by mouth at bedtime as needed for anxiety., Disp: 30 tablet, Rfl: 5 .  [START ON 08/06/2020] amphetamine-dextroamphetamine (ADDERALL) 10 MG tablet, Take 1 tablet (10 mg total) by mouth 3 (three) times daily., Disp: 90 tablet, Rfl: 0 .  [START ON 07/08/2020] amphetamine-dextroamphetamine (ADDERALL) 10 MG tablet, Take 1 tablet (10 mg total) by mouth 3 (three) times daily. bid, Disp: 90 tablet, Rfl: 0 .  [START ON 06/08/2020] amphetamine-dextroamphetamine (ADDERALL) 10 MG tablet, Take 1 tablet (10 mg total) by mouth 3 (three) times daily., Disp: 90 tablet, Rfl: 0 .  penicillin v potassium (VEETID) 500 MG tablet, Take 500 mg by mouth 4 (four) times daily. (Patient not taking: Reported on 05/27/2020), Disp: , Rfl:  Medication Side Effects: none  Family Medical/ Social History: Changes? See   MENTAL  HEALTH EXAM:  Last menstrual period 08/03/2011.There is no height or weight on file to calculate BMI.  General Appearance: unable to assess  Eye Contact:  Unable to assess  Speech:  Clear and Coherent and Normal Rate  Volume:  Normal  Mood:  Euthymic  Affect:  Unable to assess   Thought Process:  Goal Directed and Descriptions of Associations: Intact  Orientation:  Full (Time, Place, and Person)  Thought Content: Logical   Suicidal Thoughts:  No  Homicidal Thoughts:  No  Memory:  WNL  Judgement:  Good  Insight:  Good  Psychomotor Activity:  Unable to assess  Concentration:  Concentration: Good and Attention Span: Good  Recall:  Good  Fund of Knowledge: Good  Language: Good  Assets:  Desire for Improvement  ADL's:  Intact  Cognition: WNL  Prognosis:  Good    DIAGNOSES:    ICD-10-CM   1. Attention deficit hyperactivity disorder (ADHD), unspecified ADHD type  F90.9   2. Generalized anxiety disorder  F41.1   3. Major depression, recurrent, full remission (HCC)  F33.42   4. Insomnia, unspecified type  G47.00     Receiving Psychotherapy: No    RECOMMENDATIONS:  PDMP was reviewed. I provided 25 minutes of nonface-to-face time during this encounter, in which we discussed her response to the medications.  She is in a good place right now so no changes will be made. Continue Zoloft 100 mg, 1 p.o. daily. Continue Adderall 10 mg 1 every morning, 1 po at lunch and 1 po late afternoon.  Continue Xanax 0.5 mg 1 nightly as needed anxiety or sleep. She understands this is not a common practice but since she's only using it at night for sleep. Return in 6 months.   Melony Overly, PA-C

## 2020-09-23 ENCOUNTER — Telehealth: Payer: Self-pay | Admitting: Physician Assistant

## 2020-09-23 NOTE — Telephone Encounter (Signed)
Pt left a message that she needs a refill on her adderall 10 mg [facility] [address]

## 2020-09-25 ENCOUNTER — Other Ambulatory Visit: Payer: Self-pay

## 2020-09-25 MED ORDER — AMPHETAMINE-DEXTROAMPHETAMINE 10 MG PO TABS: 10.0000 mg | ORAL_TABLET | Freq: Three times a day (TID) | ORAL | 0 refills | Status: DC

## 2020-11-12 ENCOUNTER — Telehealth: Payer: Self-pay | Admitting: Physician Assistant

## 2020-11-12 NOTE — Telephone Encounter (Signed)
Pt left a message taht he needs a refill on his adderal  10 mg [facility] [address]

## 2020-11-13 ENCOUNTER — Other Ambulatory Visit: Payer: Self-pay

## 2020-11-13 MED ORDER — AMPHETAMINE-DEXTROAMPHETAMINE 10 MG PO TABS
10.0000 mg | ORAL_TABLET | Freq: Three times a day (TID) | ORAL | 0 refills | Status: DC
Start: 2020-11-13 — End: 2021-01-14

## 2020-11-13 NOTE — Telephone Encounter (Signed)
Pended.

## 2020-11-23 ENCOUNTER — Other Ambulatory Visit: Payer: Self-pay | Admitting: Physician Assistant

## 2020-11-25 NOTE — Telephone Encounter (Signed)
Please schedule appt

## 2020-11-27 NOTE — Telephone Encounter (Signed)
Pt has an appt 10/24

## 2020-11-27 NOTE — Telephone Encounter (Signed)
Last filled 8.9.22

## 2020-12-23 ENCOUNTER — Telehealth: Payer: Self-pay | Admitting: Physician Assistant

## 2020-12-23 ENCOUNTER — Other Ambulatory Visit: Payer: Self-pay

## 2020-12-23 MED ORDER — AMPHETAMINE-DEXTROAMPHETAMINE 10 MG PO TABS: 10.0000 mg | ORAL_TABLET | Freq: Three times a day (TID) | ORAL | 0 refills | Status: DC

## 2020-12-23 NOTE — Telephone Encounter (Signed)
Patient lm requesting refills on the Adderall and Xanax. [id number] [place], [place] - [address] [facility] [address]  [address] [place] [address]  Phone:  [phone]  Fax:  [phone] . Patient is scheduled for a MyChart appt on 10/24.

## 2020-12-23 NOTE — Telephone Encounter (Signed)
Pended adderall xanax not due until 10/11

## 2021-01-11 ENCOUNTER — Telehealth: Payer: No Typology Code available for payment source | Admitting: Physician Assistant

## 2021-01-11 NOTE — Telephone Encounter (Signed)
Refills on file at pharmacy

## 2021-01-11 NOTE — Progress Notes (Signed)
Unable to connect via [facility]. Pt will call office and r/s.

## 2021-01-11 NOTE — Telephone Encounter (Signed)
Pt wasn't able to connect on Mychart.  She rescheduled for in person visit on 11/22 and is on the cancellation list also.  She requests Xanax and Adderall scripts [facility] [address]

## 2021-01-14 ENCOUNTER — Encounter: Payer: Self-pay | Admitting: Physician Assistant

## 2021-01-14 ENCOUNTER — Ambulatory Visit (INDEPENDENT_AMBULATORY_CARE_PROVIDER_SITE_OTHER): Payer: No Typology Code available for payment source | Admitting: Physician Assistant

## 2021-01-14 ENCOUNTER — Other Ambulatory Visit: Payer: Self-pay

## 2021-01-14 DIAGNOSIS — G4700 Insomnia, unspecified: Secondary | ICD-10-CM | POA: Diagnosis not present

## 2021-01-14 DIAGNOSIS — F411 Generalized anxiety disorder: Secondary | ICD-10-CM

## 2021-01-14 DIAGNOSIS — F32A Depression, unspecified: Secondary | ICD-10-CM

## 2021-01-14 DIAGNOSIS — F909 Attention-deficit hyperactivity disorder, unspecified type: Secondary | ICD-10-CM | POA: Diagnosis not present

## 2021-01-14 MED ORDER — SERTRALINE HCL 100 MG PO TABS: 150.0000 mg | ORAL_TABLET | Freq: Every day | ORAL | 3 refills | Status: DC

## 2021-01-14 MED ORDER — AMPHETAMINE-DEXTROAMPHETAMINE 15 MG PO TABS: 15.0000 mg | ORAL_TABLET | Freq: Three times a day (TID) | ORAL | 0 refills | Status: DC

## 2021-01-14 NOTE — Progress Notes (Signed)
[facility]  Patient ID: Chelsea Byrd,  MRN: [id number]  PCP: Patient, No Pcp Per (Inactive)  Date of Evaluation: 01/14/2021 Time spent:30 minutes  Chief Complaint:  Chief Complaint   Anxiety; Depression; ADD; Follow-up      HISTORY/CURRENT STATUS: For routine med check.  She is doing well except normal life stressors.  Work is very busy, still working with [employer] for over 25 years now.  She does get kind of down, not having a lot of energy or motivation sometimes.  She got a new dog and that will help her at least get out of her apartment.  She is not missing work because of the way she feels though.  She sleeps pretty well most of the time.  Appetite is normal and weight is stable.  Does not cry easily.  Does have anxiety still at times.  She does take Xanax but not often.  No suicidal or homicidal thoughts.  Adderall not working as well as it did.  It helped some but she feels like it wears off faster.  Has a hard time focusing without a lot of distractions.  Patient denies increased energy with decreased need for sleep, no increased talkativeness, no racing thoughts, no impulsivity or risky behaviors, no increased spending, no increased libido, no grandiosity, no increased irritability or anger, and no hallucinations.  Denies dizziness, syncope, seizures, numbness, tingling, tremor, tics, unsteady gait, slurred speech, confusion. Denies muscle or joint pain, stiffness, or dystonia.  Individual Medical History/ Review of Systems: Changes? :No    Past medications for mental health diagnoses include: Effexor, Latuda, Ambien, Sonata wasn't effective  Allergies: Patient has no known allergies.  Current Medications:  Current Outpatient Medications:    ALPRAZolam (XANAX) 0.5 MG tablet, TAKE 1 TABLET BY MOUTH AT BEDTIME AS NEEDED FOR ANXIETY., Disp: 30 tablet, Rfl: 1   amphetamine-dextroamphetamine (ADDERALL) 15 MG tablet, Take 1 tablet by mouth 3 (three) times  daily., Disp: 90 tablet, Rfl: 0   penicillin v potassium (VEETID) 500 MG tablet, Take 500 mg by mouth 4 (four) times daily. (Patient not taking: No sig reported), Disp: , Rfl:    sertraline (ZOLOFT) 100 MG tablet, Take 1.5 tablets (150 mg total) by mouth daily., Disp: 135 tablet, Rfl: 3 Medication Side Effects: none  Family Medical/ Social History: Changes? New dog named [name].   MENTAL HEALTH EXAM:  Last menstrual period 08/03/2011.There is no height or weight on file to calculate BMI.  General Appearance: Casual and Well Groomed  Eye Contact:  Good  Speech:  Clear and Coherent and Normal Rate  Volume:  Normal  Mood:  Euthymic  Affect:  Congruent  Thought Process:  Goal Directed and Descriptions of Associations: Intact  Orientation:  Full (Time, Place, and Person)  Thought Content: Logical   Suicidal Thoughts:  No  Homicidal Thoughts:  No  Memory:  WNL  Judgement:  Good  Insight:  Good  Psychomotor Activity:  Normal  Concentration:  Concentration: Good and Attention Span: Good  Recall:  Good  Fund of Knowledge: Good  Language: Good  Assets:  Desire for Improvement  ADL's:  Intact  Cognition: WNL  Prognosis:  Good    DIAGNOSES:    ICD-10-CM   1. Attention deficit hyperactivity disorder (ADHD), unspecified ADHD type  F90.9     2. Depression, unspecified depression type  F32.A     3. Generalized anxiety disorder  F41.1     4. Insomnia, unspecified type  G47.00  Receiving Psychotherapy: No    RECOMMENDATIONS:  PDMP was reviewed. Xanax filled 01/05/2021, Adderal filled 12/23/2020. I provided 30 minutes of face to face time during this encounter, including time spent before and after the visit in records review, medical decision making, counseling pertinent to today's visit, and charting.  We discussed increasing the Zoloft in hopes to help with the situational anxiety.  She can still take the Xanax occasionally but because she is on the Adderall as well she needs  to limit that. Also recommend increasing the Adderall as it is no longer as effective. Continue Xanax 0.5 mg 1 nightly as needed anxiety or sleep. She understands this is not a common practice but since she's only using it at night for sleep. Increase Adderall to 15 mg, 1 p.o. 3 times daily. Increase Zoloft 100mg  to 1.5 pills daily. Return in 6-8 wks.   , PA-C

## 2021-01-27 ENCOUNTER — Other Ambulatory Visit: Payer: Self-pay | Admitting: Physician Assistant

## 2021-01-27 NOTE — Telephone Encounter (Signed)
Pt LVM stating that Chelsea Byrd changed her Adderall.  Her pharmacy [facility] [address], says they only have the 20mg  available.  (She said the pharmacy told her none was available within a 25 mile radius.)  Pls advise what she should do.  Next appt 12/13

## 2021-01-28 MED ORDER — AMPHETAMINE-DEXTROAMPHETAMINE 20 MG PO TABS
20.0000 mg | ORAL_TABLET | Freq: Two times a day (BID) | ORAL | 0 refills | Status: DC
Start: 2021-01-28 — End: 2021-03-02

## 2021-01-28 NOTE — Telephone Encounter (Signed)
Please confirm with the pharmacy that they do not have the 15 mg in stock.  If they do have 20 mg I will send in #60 and she will take it twice a day.  Cancel the prescription for 15 mg if they do not have it.  Thank you.

## 2021-02-09 ENCOUNTER — Ambulatory Visit: Payer: No Typology Code available for payment source | Admitting: Physician Assistant

## 2021-03-02 ENCOUNTER — Telehealth: Payer: Self-pay | Admitting: Physician Assistant

## 2021-03-02 ENCOUNTER — Encounter: Payer: Self-pay | Admitting: Physician Assistant

## 2021-03-02 ENCOUNTER — Ambulatory Visit (INDEPENDENT_AMBULATORY_CARE_PROVIDER_SITE_OTHER): Payer: No Typology Code available for payment source | Admitting: Physician Assistant

## 2021-03-02 ENCOUNTER — Other Ambulatory Visit: Payer: Self-pay

## 2021-03-02 DIAGNOSIS — F909 Attention-deficit hyperactivity disorder, unspecified type: Secondary | ICD-10-CM

## 2021-03-02 DIAGNOSIS — F32A Depression, unspecified: Secondary | ICD-10-CM

## 2021-03-02 DIAGNOSIS — F411 Generalized anxiety disorder: Secondary | ICD-10-CM

## 2021-03-02 MED ORDER — BUPROPION HCL ER (XL) 150 MG PO TB24: 150.0000 mg | ORAL_TABLET | Freq: Every day | ORAL | 1 refills | Status: DC

## 2021-03-02 MED ORDER — AMPHETAMINE-DEXTROAMPHETAMINE 20 MG PO TABS: 20.0000 mg | ORAL_TABLET | Freq: Two times a day (BID) | ORAL | 0 refills | Status: DC

## 2021-03-02 NOTE — Telephone Encounter (Signed)
Next visit is 03/02/21. Requesting refill on Adderall 20 mg called to:  [id number] [place], [place] - [address]  Phone:  [phone]  Fax:  [phone]

## 2021-03-02 NOTE — Progress Notes (Signed)
[facility]  Patient ID: Chelsea Byrd,  MRN: [id number]  PCP: Patient, No Pcp Per (Inactive)  Date of Evaluation: 03/02/2021 Time spent:20 minutes  Chief Complaint:  Chief Complaint   ADHD; Depression; Anxiety; Follow-up     HISTORY/CURRENT STATUS: For routine med check.  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 It's helping some but is really 5 mg less than what she was taking daily.  She just has not been able to find that dose.  Overall she is able to focus fine.  She still works long hours at a [employer] in [place].  Zoloft was increased at the last visit as well.  She thinks it may be helping just a little bit but is wondering if there might be another medication that might give her a boost in energy or something.  She does tend to get more depressed and sad at this time of year, but also her divorce was final a week or 2 ago and that understandably has her sad.  She does not cry easily.  She does not do anything at home but sleep, but she works long hours and goes to work in the dark and gets home after dark and is tired and ready to go to bed anyway.  That is no worse than it has been.  Appetite is normal and weight is stable.  She does have anxiety at times but it is not common.  She does use Xanax on occasion.  No suicidal or homicidal thoughts.  Patient denies increased energy with decreased need for sleep, no increased talkativeness, no racing thoughts, no impulsivity or risky behaviors, no increased spending, no increased libido, no grandiosity, no increased irritability or anger, and no hallucinations.  Denies dizziness, syncope, seizures, numbness, tingling, tremor, tics, unsteady gait, slurred speech, confusion. Denies muscle or joint pain, stiffness, or dystonia.  Individual Medical History/ Review of Systems: Changes? :No    Past medications for mental health diagnoses include: Effexor, Latuda, Ambien, Sonata wasn't  effective  Allergies: Patient has no known allergies.  Current Medications:  Current Outpatient Medications:    ALPRAZolam (XANAX) 0.5 MG tablet, TAKE 1 TABLET BY MOUTH AT BEDTIME AS NEEDED FOR ANXIETY., Disp: 30 tablet, Rfl: 1   buPROPion (WELLBUTRIN XL) 150 MG 24 hr tablet, Take 1 tablet (150 mg total) by mouth daily., Disp: 30 tablet, Rfl: 1   sertraline (ZOLOFT) 100 MG tablet, Take 1.5 tablets (150 mg total) by mouth daily., Disp: 135 tablet, Rfl: 3   amphetamine-dextroamphetamine (ADDERALL) 15 MG tablet, Take 1 tablet by mouth 3 (three) times daily. (Patient not taking: Reported on 03/02/2021), Disp: 90 tablet, Rfl: 0   amphetamine-dextroamphetamine (ADDERALL) 20 MG tablet, Take 1 tablet (20 mg total) by mouth 2 (two) times daily., Disp: 60 tablet, Rfl: 0   penicillin v potassium (VEETID) 500 MG tablet, Take 500 mg by mouth 4 (four) times daily. (Patient not taking: Reported on 05/27/2020), Disp: , Rfl:  Medication Side Effects: none  Family Medical/ Social History: Changes? New dog named [name].   MENTAL HEALTH EXAM:  Last menstrual period 08/03/2011.There is no height or weight on file to calculate BMI.  General Appearance: Casual, Well Groomed, and Obese  Eye Contact:  Good  Speech:  Clear and Coherent and Normal Rate  Volume:  Normal  Mood:  Euthymic  Affect:  Congruent  Thought Process:  Goal Directed and Descriptions of Associations: Circumstantial  Orientation:  Full (Time, Place, and Person)  Thought Content: Logical  Suicidal Thoughts:  No  Homicidal Thoughts:  No  Memory:  WNL  Judgement:  Good  Insight:  Good  Psychomotor Activity:  Normal  Concentration:  Concentration: Good and Attention Span: Good  Recall:  Good  Fund of Knowledge: Good  Language: Good  Assets:  Desire for Improvement  ADL's:  Intact  Cognition: WNL  Prognosis:  Good    DIAGNOSES:    ICD-10-CM   1. Depression, unspecified depression type  F32.A     2. Attention deficit hyperactivity  disorder (ADHD), unspecified ADHD type  F90.9     3. Generalized anxiety disorder  F41.1         Receiving Psychotherapy: No    RECOMMENDATIONS:  PDMP was reviewed. Adderall filled 01/28/21. I provided 20 minutes of face to face time during this encounter, including time spent before and after the visit in records review, medical decision making, counseling pertinent to today's visit, and charting.  We discussed other options to help with motivation and energy.  Recommend Wellbutrin.  She has no history of eating disorder or seizures.  Benefits, risks and side effects were discussed and she would like to try it. Recommend mood therapy light. Start Wellbutrin XL 150 mg, 1 p.o. every morning. Continue Xanax 0.5 mg 1 nightly as needed anxiety or sleep. She understands this is not a common practice but since she's only using it at night for sleep. Continue Adderall 20 mg, 1 p.o. every morning and 1/2 pill at lunch and supper as needed.  (She will call in approximately 3 weeks to let me know if she is able to find the 15 mg then I will prescribe 1 p.o. 3 times daily.  Or if she can find 30 mg I would prescribe one half p.o. 3 times daily.) Continue Zoloft 100mg , 1.5 pills daily. Return in 6 weeks.  , PA-C

## 2021-03-02 NOTE — Telephone Encounter (Signed)
Pended.

## 2021-03-25 ENCOUNTER — Other Ambulatory Visit: Payer: Self-pay | Admitting: Physician Assistant

## 2021-03-31 ENCOUNTER — Telehealth: Payer: Self-pay | Admitting: Physician Assistant

## 2021-03-31 ENCOUNTER — Other Ambulatory Visit: Payer: Self-pay

## 2021-03-31 MED ORDER — AMPHETAMINE-DEXTROAMPHETAMINE 15 MG PO TABS: 15.0000 mg | ORAL_TABLET | Freq: Three times a day (TID) | ORAL | 0 refills | Status: DC

## 2021-03-31 NOTE — Telephone Encounter (Signed)
Pt LVM asking for refill of Adderall 15mg  to be sent to [facility] [address]  They have it in stock.  Next appt 1/25

## 2021-03-31 NOTE — Telephone Encounter (Signed)
Pended.

## 2021-04-01 ENCOUNTER — Telehealth: Payer: Self-pay

## 2021-04-01 NOTE — Telephone Encounter (Signed)
Patient lm today stating the Adderall is in stock, but it is requiring a PA.

## 2021-04-01 NOTE — Telephone Encounter (Signed)
BIN: [id number] PCN:[id number] [id number]

## 2021-04-01 NOTE — Telephone Encounter (Signed)
PA has been submitted for amphetamine-dextroamphetamine (ADDERALL) 15 MG tablet #90 through [facility].Awaiting response

## 2021-04-01 NOTE — Telephone Encounter (Signed)
PA SUBMITTED.

## 2021-04-14 ENCOUNTER — Ambulatory Visit: Payer: No Typology Code available for payment source | Admitting: Physician Assistant

## 2021-04-23 ENCOUNTER — Other Ambulatory Visit: Payer: Self-pay | Admitting: Physician Assistant

## 2021-04-28 ENCOUNTER — Other Ambulatory Visit: Payer: Self-pay | Admitting: Physician Assistant

## 2021-05-21 ENCOUNTER — Telehealth: Payer: Self-pay | Admitting: Physician Assistant

## 2021-05-21 NOTE — Telephone Encounter (Signed)
LVM to see if she had checked availability of medication and to also let her know that she needed to schedule an appt.  ?

## 2021-05-21 NOTE — Telephone Encounter (Signed)
Pt lvm that she needs a refill on her adderall 15 mg [facility] [address] ?

## 2021-05-25 ENCOUNTER — Other Ambulatory Visit: Payer: Self-pay

## 2021-05-25 ENCOUNTER — Telehealth: Payer: Self-pay | Admitting: Physician Assistant

## 2021-05-25 NOTE — Telephone Encounter (Signed)
pended

## 2021-05-25 NOTE — Telephone Encounter (Signed)
Pt called at 9 am and made an appt for tomorrow 3/8. She needs a refill on her adderall ?15 mg . [facility] [address] [facility] in stock ?

## 2021-05-26 ENCOUNTER — Encounter: Payer: Self-pay | Admitting: Physician Assistant

## 2021-05-26 ENCOUNTER — Other Ambulatory Visit: Payer: Self-pay

## 2021-05-26 ENCOUNTER — Ambulatory Visit (INDEPENDENT_AMBULATORY_CARE_PROVIDER_SITE_OTHER): Payer: No Typology Code available for payment source | Admitting: Physician Assistant

## 2021-05-26 DIAGNOSIS — F331 Major depressive disorder, recurrent, moderate: Secondary | ICD-10-CM

## 2021-05-26 DIAGNOSIS — F909 Attention-deficit hyperactivity disorder, unspecified type: Secondary | ICD-10-CM

## 2021-05-26 MED ORDER — AMPHETAMINE-DEXTROAMPHETAMINE 15 MG PO TABS
15.0000 mg | ORAL_TABLET | Freq: Three times a day (TID) | ORAL | 0 refills | Status: DC
Start: 2021-06-25 — End: 2021-09-03

## 2021-05-26 MED ORDER — AMPHETAMINE-DEXTROAMPHETAMINE 15 MG PO TABS
15.0000 mg | ORAL_TABLET | Freq: Three times a day (TID) | ORAL | 0 refills | Status: DC
Start: 2021-07-24 — End: 2021-09-03

## 2021-05-26 MED ORDER — BUPROPION HCL ER (XL) 300 MG PO TB24: 300.0000 mg | ORAL_TABLET | Freq: Every morning | ORAL | 0 refills | Status: DC

## 2021-05-26 MED ORDER — AMPHETAMINE-DEXTROAMPHETAMINE 15 MG PO TABS: 15.0000 mg | ORAL_TABLET | Freq: Three times a day (TID) | ORAL | 0 refills | Status: DC

## 2021-05-26 NOTE — Progress Notes (Signed)
[facility] ? ?Patient ID: Chelsea Byrd,  ?MRN: [id number] ? ?PCP: Patient, No Pcp Per (Inactive) ? ?Date of Evaluation: 05/26/2021 ?Time spent:20 minutes ? ?Chief Complaint:  ?Chief Complaint   ?Depression; ADHD; Follow-up ?  ? ? ? ?HISTORY/CURRENT STATUS: ?For routine med check. ? ?At the last visit we added Wellbutrin.  States it helped for a little while but now it is not helping at all, is back at her baseline.  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  She does not shop for groceries or anything, has things delivered.  Energy is okay but motivation is low.  She does not do anything for fun really.  She has always kind of been a person who stays to herself, prefers being "one-on-one versus being in a big group" but it has gotten worse over the past 3 or 4 years.  She does not cry easily.  Sleeps okay.  Not having a lot of anxiety.  No suicidal or homicidal thoughts. ? ?States that attention is good without easy distractibility.  Able to focus on things and finish tasks to completion.  She has been off the Adderall for several weeks, due to the difficulty getting it because of the [employer]. ? ?Patient denies increased energy with decreased need for sleep, no increased talkativeness, no racing thoughts, no impulsivity or risky behaviors, no increased spending, no increased libido, no grandiosity, no increased irritability or anger, and no hallucinations. ? ?Denies dizziness, syncope, seizures, numbness, tingling, tremor, tics, unsteady gait, slurred speech, confusion. Denies muscle or joint pain, stiffness, or dystonia. ? ?Individual Medical History/ Review of Systems: Changes? :No   ? ?Past medications for mental health diagnoses include: ?Effexor, Latuda, Ambien, Sonata wasn't effective ? ?Allergies: Patient has no known allergies. ? ?Current Medications:  ?Current Outpatient Medications:  ?   [START ON 06/25/2021] amphetamine-dextroamphetamine (ADDERALL) 15 MG tablet, Take 1 tablet by mouth 3 (three) times daily., Disp: 90 tablet, Rfl: 0 ?  [START ON 07/24/2021] amphetamine-dextroamphetamine (ADDERALL) 15 MG tablet, Take 1 tablet by mouth 3 (three) times daily., Disp: 90 tablet, Rfl: 0 ?  buPROPion (WELLBUTRIN XL) 300 MG 24 hr tablet, Take 1 tablet (300 mg total) by mouth in the morning., Disp: 90 tablet, Rfl: 0 ?  Melaton-Thean-Cham-PassF-LBalm (MELATONIN + L-THEANINE PO), Take by mouth., Disp: , Rfl:  ?  sertraline (ZOLOFT) 100 MG tablet, Take 1.5 tablets (150 mg total) by mouth daily., Disp: 135 tablet, Rfl: 3 ?  ALPRAZolam (XANAX) 0.5 MG tablet, TAKE 1 TABLET BY MOUTH AT BEDTIME AS NEEDED FOR ANXIETY. (Patient not taking: Reported on 05/26/2021), Disp: 30 tablet, Rfl: 1 ?  amphetamine-dextroamphetamine (ADDERALL) 15 MG tablet, Take 1 tablet by mouth 3 (three) times daily., Disp: 90 tablet, Rfl: 0 ?  penicillin v potassium (VEETID) 500 MG tablet, Take 500 mg by mouth 4 (four) times daily. (Patient not taking: Reported on 05/27/2020), Disp: , Rfl:  ?Medication Side Effects: none ? ?Family Medical/ Social History: Changes? no ? ?MENTAL HEALTH EXAM: ? ?Last menstrual period 08/03/2011.There is no height or weight on file to calculate BMI.  ?General Appearance: Casual, Well Groomed, and Obese  ?Eye Contact:  Good  ?Speech:  Clear and Coherent and Normal Rate  ?Volume:  Normal  ?Mood:  Depressed  ?Affect:  Congruent  ?Thought Process:  Goal Directed and Descriptions of Associations: Circumstantial  ?Orientation:  Full (Time,  Place, and Person)  ?Thought Content: Logical   ?Suicidal Thoughts:  No  ?Homicidal Thoughts:  No  ?Memory:  WNL  ?Judgement:  Good  ?Insight:  Good  ?Psychomotor Activity:  Normal  ?Concentration:  Concentration: Good and Attention Span: Good  ?Recall:  Good  ?Fund of Knowledge: Good  ?Language: Good  ?Assets:  Desire for Improvement  ?ADL's:  Intact  ?Cognition: WNL  ?Prognosis:  Good   ? ? ?DIAGNOSES:  ?  ICD-10-CM   ?1. Attention deficit hyperactivity disorder (ADHD), unspecified ADHD type  F90.9   ?  ?2. Major depressive disorder, recurrent episode, moderate (HCC)  F33.1   ?  ? ? ? ?Receiving Psychotherapy: No  ? ? ?RECOMMENDATIONS:  ?PDMP was reviewed. Adderall filled 04/02/2021.   ?I provided 20 minutes of face to face time during this encounter, including time spent before and after the visit in records review, medical decision making, counseling pertinent to today's visit, and charting.  ?Recommend increasing the Wellbutrin to help with the melancholy depression.  She would like to try it. ? ?Not taking the Xanax any longer. ?Continue Adderall 15 mg, 1 p.o. 3 times daily.  (Restarting after having been out for several weeks.) ?Increase Wellbutrin XL to 300 mg, 1 p.o. every morning. ?Continue Zoloft 100mg , 1.5 pills daily. ?Return in 6 weeks. ? ? , PA-C  ?

## 2021-07-06 ENCOUNTER — Ambulatory Visit (INDEPENDENT_AMBULATORY_CARE_PROVIDER_SITE_OTHER): Payer: Self-pay | Admitting: Physician Assistant

## 2021-07-06 DIAGNOSIS — Z91199 Patient's noncompliance with other medical treatment and regimen due to unspecified reason: Secondary | ICD-10-CM

## 2021-07-06 NOTE — Progress Notes (Signed)
   Complete physical exam  Patient: Chelsea Byrd   DOB: 01/08/1999   46 y.o. Female  MRN: [id number]  Subjective:    No chief complaint on file.   Chelsea Byrd is a 46 y.o. female who presents today for a complete physical exam. She reports consuming a {diet types:17450} diet. {types:19826} She generally feels {DESC; WELL/FAIRLY WELL/POORLY:18703}. She reports sleeping {DESC; WELL/FAIRLY WELL/POORLY:18703}. She {does/does not:200015} have additional problems to discuss today.    Most recent fall risk assessment:    09/15/2021   10:42 AM  Fall Risk   Falls in the past year? 0  Number falls in past yr: 0  Injury with Fall? 0  Risk for fall due to : No Fall Risks  Follow up Falls evaluation completed     Most recent depression screenings:    09/15/2021   10:42 AM 08/06/2020   10:46 AM  PHQ 2/9 Scores  PHQ - 2 Score 0 0  PHQ- 9 Score 5     {VISON DENTAL STD PSA (Optional):27386}  {History (Optional):23778}  Patient Care Team: Jessup, Joy, NP as PCP - General (Nurse Practitioner)   Outpatient Medications Prior to Visit  Medication Sig   fluticasone (FLONASE) 50 MCG/ACT nasal spray Place 2 sprays into both nostrils in the morning and at bedtime. After 7 days, reduce to once daily.   norgestimate-ethinyl estradiol (SPRINTEC 28) 0.25-35 MG-MCG tablet Take 1 tablet by mouth daily.   Nystatin POWD Apply liberally to affected area 2 times per day   spironolactone (ALDACTONE) 100 MG tablet Take 1 tablet (100 mg total) by mouth daily.   No facility-administered medications prior to visit.    ROS        Objective:     There were no vitals taken for this visit. {Vitals History (Optional):23777}  Physical Exam   No results found for any visits on 10/21/21. {Show previous labs (optional):23779}    Assessment & Plan:    Routine Health Maintenance and Physical Exam  Immunization History  Administered Date(s) Administered   DTaP 03/24/1999, 05/20/1999,  07/29/1999, 04/13/2000, 10/28/2003   Hepatitis A 08/24/2007, 08/29/2008   Hepatitis B 01/09/1999, 02/16/1999, 07/29/1999   HiB (PRP-OMP) 03/24/1999, 05/20/1999, 07/29/1999, 04/13/2000   IPV 03/24/1999, 05/20/1999, 01/17/2000, 10/28/2003   Influenza,inj,Quad PF,6+ Mos 11/29/2013   Influenza-Unspecified 02/29/2012   MMR 01/16/2001, 10/28/2003   Meningococcal Polysaccharide 08/29/2011   Pneumococcal Conjugate-13 04/13/2000   Pneumococcal-Unspecified 07/29/1999, 10/12/1999   Tdap 08/29/2011   Varicella 01/17/2000, 08/24/2007    Health Maintenance  Topic Date Due   HIV Screening  Never done   Hepatitis C Screening  Never done   INFLUENZA VACCINE  10/19/2021   PAP-Cervical Cytology Screening  10/21/2021 (Originally 01/08/2020)   PAP SMEAR-Modifier  10/21/2021 (Originally 01/08/2020)   TETANUS/TDAP  10/21/2021 (Originally 08/28/2021)   HPV VACCINES  Discontinued   COVID-19 Vaccine  Discontinued    Discussed health benefits of physical activity, and encouraged her to engage in regular exercise appropriate for her age and condition.  Problem List Items Addressed This Visit   None Visit Diagnoses     Annual physical exam    -  Primary   Cervical cancer screening       Need for Tdap vaccination          No follow-ups on file.     Joy Jessup, NP   

## 2021-08-20 ENCOUNTER — Other Ambulatory Visit: Payer: Self-pay | Admitting: Physician Assistant

## 2021-08-23 NOTE — Telephone Encounter (Signed)
Please call to schedule an appt. Last seen 3/8 with RTC in 6 weeks, was a no show 4/18.

## 2021-08-24 NOTE — Telephone Encounter (Signed)
LVM for patient to call and schedule

## 2021-09-03 ENCOUNTER — Encounter: Payer: Self-pay | Admitting: Physician Assistant

## 2021-09-03 ENCOUNTER — Ambulatory Visit (INDEPENDENT_AMBULATORY_CARE_PROVIDER_SITE_OTHER): Payer: No Typology Code available for payment source | Admitting: Physician Assistant

## 2021-09-03 DIAGNOSIS — F3342 Major depressive disorder, recurrent, in full remission: Secondary | ICD-10-CM

## 2021-09-03 DIAGNOSIS — F909 Attention-deficit hyperactivity disorder, unspecified type: Secondary | ICD-10-CM

## 2021-09-03 DIAGNOSIS — F411 Generalized anxiety disorder: Secondary | ICD-10-CM | POA: Diagnosis not present

## 2021-09-03 MED ORDER — AMPHETAMINE-DEXTROAMPHETAMINE 30 MG PO TABS
15.0000 mg | ORAL_TABLET | Freq: Four times a day (QID) | ORAL | 0 refills | Status: DC
Start: 2021-09-03 — End: 2021-10-19

## 2021-09-03 NOTE — Progress Notes (Signed)
[facility]  Patient ID: Chelsea Byrd,  MRN: [id number]  PCP: Patient, No Pcp Per  Date of Evaluation: 09/03/2021 Time spent:20 minutes  Chief Complaint:  Chief Complaint   ADHD; Follow-up     HISTORY/CURRENT STATUS: For routine med check.  Stopped Zoloft and Wellbutrin end of April. Had a stomach bug and couldn't keep it down, but then shortly after that, she went to visit family/friends in [place] and forgot to take her meds with her. So she decided to stay off. Is doing well and wants to stay off the medicine for now.  Patient denies loss of interest in usual activities and is able to enjoy things.  She is actually going out of the house and doing things more than she did when she was on the Zoloft and Wellbutrin.  Denies decreased energy.  Denies decreased motivation.  Work is going well.  ADLs and personal hygiene are normal.  Appetite has not changed.  Weight is stable.  No extreme sadness, tearfulness, or feelings of hopelessness.  Sleeps well most of the time.  No complaints of anxiety for the most part.  Denies suicidal or homicidal thoughts.  The Adderall is not working as well as she really needs.  Especially in the morning dose.  She sometimes works 15-hour days and it does not last long enough either.  Gets distracted easily and cannot stay on task.   Patient denies increased energy with decreased need for sleep, no increased talkativeness, no racing thoughts, no impulsivity or risky behaviors, no increased spending, no increased libido, no grandiosity, no increased irritability or anger, and no hallucinations.  Denies dizziness, syncope, seizures, numbness, tingling, tremor, tics, unsteady gait, slurred speech, confusion. Denies muscle or joint pain, stiffness, or dystonia.  Individual Medical History/ Review of Systems: Changes? :No    Past medications for mental health diagnoses include: Effexor, Latuda, Ambien, Sonata wasn't effective  Allergies:  Patient has no known allergies.  Current Medications:  Current Outpatient Medications:    amphetamine-dextroamphetamine (ADDERALL) 30 MG tablet, Take 0.5 tablets by mouth in the morning, at noon, in the evening, and at bedtime., Disp: 60 tablet, Rfl: 0   Melaton-Thean-Cham-PassF-LBalm (MELATONIN + L-THEANINE PO), Take by mouth., Disp: , Rfl:    ALPRAZolam (XANAX) 0.5 MG tablet, TAKE 1 TABLET BY MOUTH AT BEDTIME AS NEEDED FOR ANXIETY. (Patient not taking: Reported on 05/26/2021), Disp: 30 tablet, Rfl: 1   buPROPion (WELLBUTRIN XL) 300 MG 24 hr tablet, TAKE 1 TABLET (300 MG TOTAL) BY MOUTH IN THE MORNING (Patient not taking: Reported on 09/03/2021), Disp: 90 tablet, Rfl: 0   penicillin v potassium (VEETID) 500 MG tablet, Take 500 mg by mouth 4 (four) times daily. (Patient not taking: Reported on 05/27/2020), Disp: , Rfl:    sertraline (ZOLOFT) 100 MG tablet, Take 1.5 tablets (150 mg total) by mouth daily. (Patient not taking: Reported on 09/03/2021), Disp: 135 tablet, Rfl: 3 Medication Side Effects: none  Family Medical/ Social History: Changes? no  MENTAL HEALTH EXAM:  Last menstrual period 08/03/2011.There is no height or weight on file to calculate BMI.  General Appearance: Casual, Well Groomed, and Obese  Eye Contact:  Good  Speech:  Clear and Coherent and Normal Rate  Volume:  Normal  Mood:  Euthymic  Affect:  Congruent  Thought Process:  Goal Directed and Descriptions of Associations: Circumstantial  Orientation:  Full (Time, Place, and Person)  Thought Content: Logical   Suicidal Thoughts:  No  Homicidal Thoughts:  No  Memory:  WNL  Judgement:  Good  Insight:  Good  Psychomotor Activity:  Normal  Concentration:  Concentration: Good and Attention Span: Fair  Recall:  Good  Fund of Knowledge: Good  Language: Good  Assets:  Desire for Improvement  ADL's:  Intact  Cognition: WNL  Prognosis:  Good    DIAGNOSES:    ICD-10-CM   1. Attention deficit hyperactivity disorder (ADHD),  unspecified ADHD type  F90.9     2. Major depression, recurrent, full remission (HCC)  F33.42     3. Generalized anxiety disorder  F41.1       Receiving Psychotherapy: No    RECOMMENDATIONS:  PDMP was reviewed. Adderall filled 07/27/2021. I provided 20 minutes of face to face time during this encounter, including time spent before and after the visit in records review, medical decision making, counseling pertinent to today's visit, and charting.  We discussed the Zoloft and Wellbutrin.  I understand that she had a stomach bug and was not able to swallow the medication but reminded her not to stop cold [place] in the future if she does go back on these medications.  She will let me know if depression recurs and we can restart them if needed. Recommend increasing the Adderall to 30 mg, she can take 30 mg in the morning and then 15 around lunch and another 15 mg in the late afternoon.  Discontinue Wellbutrin and Zoloft.  She already has. Increase Adderall to 30 mg, 1 p.o. every morning and one half p.o. around lunch, one half p.o. around suppertime.  Or she can take one half p.o. 4 times daily.  Whichever works for her is fine. Return in 3 months.  Melony Overly, PA-C

## 2021-10-18 ENCOUNTER — Other Ambulatory Visit: Payer: Self-pay | Admitting: Physician Assistant

## 2021-10-18 NOTE — Telephone Encounter (Signed)
Pt would like refill of [facility] 15mg  [facility] [address] [place], [place].  It is available there.  They don't have the 30mg .  No upcoming appt scheduled

## 2021-10-18 NOTE — Telephone Encounter (Signed)
Should the 15 mg be sent as 1 tab 3 times daily?since 30 mg not available and 15 mg is not showing on med list

## 2021-10-19 MED ORDER — AMPHETAMINE-DEXTROAMPHETAMINE 30 MG PO TABS: 15.0000 mg | ORAL_TABLET | Freq: Four times a day (QID) | ORAL | 0 refills | Status: DC

## 2021-10-19 NOTE — Telephone Encounter (Signed)
Patient lvm regarding prior messages. [facility] [place] and they didn't get prescription yesterday. [facility] [address] and [facility]  in [facility]. Would like prescription sent there. [facility] [address] [place] Ph: [phone]

## 2021-10-19 NOTE — Telephone Encounter (Signed)
Yes please append it for 15 mg, 2 p.o. every morning, 1 p.o. at lunch, and 1 p.o. in late afternoon.  So it should be 120 pills.  Thank you

## 2021-11-23 ENCOUNTER — Other Ambulatory Visit: Payer: Self-pay

## 2021-11-23 ENCOUNTER — Telehealth: Payer: Self-pay | Admitting: Physician Assistant

## 2021-11-23 MED ORDER — AMPHETAMINE-DEXTROAMPHETAMINE 30 MG PO TABS: 15.0000 mg | ORAL_TABLET | Freq: Four times a day (QID) | ORAL | 0 refills | Status: DC

## 2021-11-23 NOTE — Telephone Encounter (Signed)
Pended.

## 2021-11-23 NOTE — Telephone Encounter (Signed)
Chelsea Byrd requesting an Adderall refill. Patient was last seen 6/16 with no follow up scheduled  Contact information # [phone]

## 2021-12-07 ENCOUNTER — Encounter (HOSPITAL_COMMUNITY): Payer: Self-pay

## 2021-12-07 ENCOUNTER — Other Ambulatory Visit: Payer: Self-pay

## 2021-12-07 ENCOUNTER — Ambulatory Visit (HOSPITAL_COMMUNITY)
Admission: RE | Admit: 2021-12-07 | Discharge: 2021-12-07 | Disposition: A | Discharge status: Home / Self Care | Payer: No Typology Code available for payment source | Source: Ambulatory Visit | Attending: Family Medicine | Admitting: Family Medicine

## 2021-12-07 VITALS — BP 129/96 | HR 112 | Temp 99.1°F | Resp 20

## 2021-12-07 DIAGNOSIS — Z1152 Encounter for screening for COVID-19: Secondary | ICD-10-CM | POA: Diagnosis present

## 2021-12-07 DIAGNOSIS — J069 Acute upper respiratory infection, unspecified: Secondary | ICD-10-CM | POA: Diagnosis present

## 2021-12-07 LAB — RESP PANEL BY RT-PCR (RSV, FLU A&B, COVID)  RVPGX2
Influenza A by PCR: NEGATIVE
Influenza B by PCR: NEGATIVE
Resp Syncytial Virus by PCR: NEGATIVE
SARS Coronavirus 2 by RT PCR: NEGATIVE

## 2021-12-07 MED ORDER — PREDNISONE 20 MG PO TABS: 20.0000 mg | ORAL_TABLET | Freq: Every day | ORAL | 0 refills | Status: AC

## 2021-12-07 NOTE — ED Provider Notes (Signed)
[name]    CSN: [id number] Arrival date & time: 12/07/21  1151      History   Chief Complaint Chief Complaint  Patient presents with   Appointment    12:00   Cough    HPI Chelsea Byrd is a 46 y.o. female.   Patient is here for uri symptoms x 4 days.  Having fatigue, sore throat, sinus congestion and drainage, as well as cough and body aches.  No fever/chills per se. No n/v.  She works at a [employer] at [place], so lots of exposure.   She has taken multiple covid tests at home, all of which have been negative.  She feels sob with taking a deep breath.  No coughing anything up.  No otc meds taken thus far.   Past Medical History:  Diagnosis Date   Abnormal Pap smear 5/ 2012   History of varicella    Infections of kidney    Irregular periods/menstrual cycles    Low iron    history with pregnancy - resolved   SVD (spontaneous vaginal delivery)    x 1   Yeast infection     Patient Active Problem List   Diagnosis Date Noted   Attention deficit hyperactivity disorder (ADHD) 04/13/2018   Depression 04/13/2018   S/P laparoscopic hysterectomy 04/01/2013   History of varicella    Low iron    Infections of kidney    Yeast infection    Irregular periods/menstrual cycles    Abnormal Pap smear 07/20/2010    Past Surgical History:  Procedure Laterality Date   BILATERAL SALPINGECTOMY Bilateral 04/01/2013   Procedure: BILATERAL SALPINGECTOMY;  Surgeon: Delice Lesch, MD;  Location: [facility];  Service: Gynecology;  Laterality: Bilateral;   CYSTOSCOPY Bilateral 04/01/2013   Procedure: CYSTOSCOPY;  Surgeon: Delice Lesch, MD;  Location: [facility];  Service: Gynecology;  Laterality: Bilateral;   DIAGNOSTIC LAPAROSCOPY  07/2011   DILATION AND CURETTAGE OF UTERUS     ENDOMETRIAL ABLATION     LAPAROSCOPIC HYSTERECTOMY N/A 04/01/2013   Procedure: HYSTERECTOMY TOTAL LAPAROSCOPIC;  Surgeon: Delice Lesch, MD;  Location: [facility];  Service: Gynecology;  Laterality:  N/A;   LAPAROSCOPIC TUBAL LIGATION  08/18/2011   Procedure: LAPAROSCOPIC TUBAL LIGATION;  Surgeon: Delice Lesch, MD;  Location: [facility];  Service: Gynecology;  Laterality: Bilateral;  poss ovarian cystectomy   OVARIAN CYST REMOVAL  08/18/2011   Procedure: OVARIAN CYSTECTOMY;  Surgeon: Delice Lesch, MD;  Location: [facility];  Service: Gynecology;  Laterality: Left;   TOOTH EXTRACTION     TUBAL LIGATION      OB History     Gravida  4   Para      Term      Preterm      AB  2   Living  1      SAB  2   IAB      Ectopic      Multiple      Live Births               Home Medications    Prior to Admission medications   Medication Sig Start Date End Date Taking? Authorizing Provider  predniSONE (DELTASONE) 20 MG tablet Take 1 tablet (20 mg total) by mouth daily for 3 days. 12/07/21 12/10/21 Yes Airlie Blumenberg, MD  amphetamine-dextroamphetamine (ADDERALL) 30 MG tablet Take 0.5 tablets by mouth in the morning, at noon, in the evening, and at bedtime. 11/23/21   Hurst,  Glade Nurse, PA-C  sertraline (ZOLOFT) 100 MG tablet Take 1.5 tablets (150 mg total) by mouth daily. Patient not taking: Reported on 09/03/2021 01/14/21   Cherie Ouch, PA-C    Family History Family History  Problem Relation Age of Onset   Cancer Maternal Aunt        OVARIAN   Diabetes Maternal Grandmother    Cancer Maternal Grandfather    Alzheimer's disease Maternal Grandfather     Social History Social History   Tobacco Use   Smoking status: Former    Packs/day: 1.00    Years: 20.00    Total pack years: 20.00    Types: Cigarettes    Quit date: 07/30/2011    Years since quitting: 10.3   Smokeless tobacco: Never  Vaping Use   Vaping Use: Some days  Substance Use Topics   Alcohol use: No   Drug use: Not Currently    Types: Marijuana    Comment: q hs     Allergies   Patient has no known allergies.   Review of Systems Review of Systems  Constitutional:  Positive for fatigue. Negative  for chills and fever.  HENT:  Positive for congestion, rhinorrhea and sore throat.   Respiratory:  Positive for cough.   Gastrointestinal: Negative.   Genitourinary: Negative.   Musculoskeletal:  Positive for arthralgias.  Psychiatric/Behavioral: Negative.       Physical Exam Triage Vital Signs ED Triage Vitals  Enc Vitals Group     BP 12/07/21 1213 (!) 129/96     Pulse Rate 12/07/21 1213 (!) 112     Resp 12/07/21 1213 20     Temp 12/07/21 1213 99.1 F (37.3 C)     Temp Source 12/07/21 1213 Oral     SpO2 12/07/21 1213 100 %     Weight --      Height --      Head Circumference --      Peak Flow --      Pain Score 12/07/21 1208 0     Pain Loc --      Pain Edu? --      Excl. in GC? --    No data found.  Updated Vital Signs BP (!) 129/96 (BP Location: Right Arm)   Pulse (!) 112   Temp 99.1 F (37.3 C) (Oral)   Resp 20   LMP 08/03/2011   SpO2 100%   Visual Acuity Right Eye Distance:   Left Eye Distance:   Bilateral Distance:    Right Eye Near:   Left Eye Near:    Bilateral Near:     Physical Exam Constitutional:      Appearance: Normal appearance.  HENT:     Head: Normocephalic.     Right Ear: Tympanic membrane normal.     Left Ear: Tympanic membrane normal.     Nose: Congestion present.     Mouth/Throat:     Mouth: Mucous membranes are moist.     Pharynx: Posterior oropharyngeal erythema present. No oropharyngeal exudate.  Cardiovascular:     Rate and Rhythm: Normal rate and regular rhythm.  Pulmonary:     Effort: Pulmonary effort is normal.     Breath sounds: Normal breath sounds.  Musculoskeletal:     Cervical back: Normal range of motion and neck supple. No rigidity or tenderness.  Skin:    General: Skin is warm.  Neurological:     General: No focal deficit present.     Mental Status: She is  alert.  Psychiatric:        Mood and Affect: Mood normal.      UC Treatments / Results  Labs (all labs ordered are listed, but only abnormal results  are displayed) Labs Reviewed  RESP PANEL BY RT-PCR (RSV, FLU A&B, COVID)  [id number]    EKG   Radiology No results found.  Procedures Procedures (including critical care time)  Medications Ordered in UC Medications - No data to display  Initial Impression / Assessment and Plan / UC Course  I have reviewed the triage vital signs and the nursing notes.  Pertinent labs & imaging results that were available during my care of the patient were reviewed by me and considered in my medical decision making (see chart for details).    Final Clinical Impressions(s) / UC Diagnoses   Final diagnoses:  Acute upper respiratory infection  Encounter for screening for COVID-19     Discharge Instructions      You were seen today for URI symptoms.  We have tested you for flu, covid and RSV.  These will be resulted later today and you will be called with any positive results.   I recommend over the counter motrin or tylenol for pain/fever.  Please get plenty of rest and fluids.  I have sent out a 3 days supply of a steroid to help your symptoms as well.    ED Prescriptions     Medication Sig Dispense Auth. Provider   predniSONE (DELTASONE) 20 MG tablet Take 1 tablet (20 mg total) by mouth daily for 3 days. 3 tablet Jannifer Franklin, MD      PDMP not reviewed this encounter.   Jannifer Franklin, MD 12/07/21 1300

## 2021-12-07 NOTE — ED Triage Notes (Signed)
Complains of cough, runny, stuffy nose, headache and very tired, and sob.  Patient has been performing home covid tests and all negative.  Patient reports symptoms started Saturday.  Has nottried any medications

## 2021-12-07 NOTE — Discharge Instructions (Addendum)
You were seen today for URI symptoms.  We have tested you for flu, covid and RSV.  These will be resulted later today and you will be called with any positive results.   I recommend over the counter motrin or tylenol for pain/fever.  Please get plenty of rest and fluids.  I have sent out a 3 days supply of a steroid to help your symptoms as well.

## 2021-12-30 ENCOUNTER — Other Ambulatory Visit: Payer: Self-pay

## 2021-12-30 ENCOUNTER — Telehealth: Payer: Self-pay | Admitting: Physician Assistant

## 2021-12-30 MED ORDER — AMPHETAMINE-DEXTROAMPHETAMINE 30 MG PO TABS: 15.0000 mg | ORAL_TABLET | Freq: Four times a day (QID) | ORAL | 0 refills | Status: DC

## 2021-12-30 NOTE — Telephone Encounter (Signed)
Please schedule appt

## 2021-12-30 NOTE — Telephone Encounter (Signed)
Pended.

## 2021-12-30 NOTE — Telephone Encounter (Signed)
Patient lvm requesting a refill on the Adderall. [facility] [address] [facility].  Last seen on 6/16 with no follow up scheduled Contact information [phone]

## 2022-01-25 ENCOUNTER — Ambulatory Visit (INDEPENDENT_AMBULATORY_CARE_PROVIDER_SITE_OTHER): Payer: No Typology Code available for payment source | Admitting: Physician Assistant

## 2022-01-25 ENCOUNTER — Encounter: Payer: Self-pay | Admitting: Physician Assistant

## 2022-01-25 DIAGNOSIS — F909 Attention-deficit hyperactivity disorder, unspecified type: Secondary | ICD-10-CM

## 2022-01-25 DIAGNOSIS — F3342 Major depressive disorder, recurrent, in full remission: Secondary | ICD-10-CM

## 2022-01-25 DIAGNOSIS — F411 Generalized anxiety disorder: Secondary | ICD-10-CM

## 2022-01-25 MED ORDER — AMPHETAMINE-DEXTROAMPHETAMINE 30 MG PO TABS: 15.0000 mg | ORAL_TABLET | Freq: Four times a day (QID) | ORAL | 0 refills | Status: DC

## 2022-01-25 MED ORDER — AMPHETAMINE-DEXTROAMPHETAMINE 30 MG PO TABS: ORAL_TABLET | ORAL | 0 refills | Status: DC

## 2022-01-25 NOTE — Progress Notes (Signed)
[facility]  Patient ID: Chelsea Byrd,  MRN: [id number]  PCP: Patient, No Pcp Per  Date of Evaluation: 01/25/2022 Time spent:20 minutes  Chief Complaint:   Chief Complaint   ADHD; Follow-up    HISTORY/CURRENT STATUS: For routine med check.  Doing well.  Adderall is still working well.  States that attention is good without easy distractibility.  Able to focus on things and finish tasks to completion.   She is a lot less stressed.  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  She is much happier and not working as much.  Patient is able to enjoy things.  Energy and motivation are good.   No extreme sadness, tearfulness, or feelings of hopelessness.  Sleeps well most of the time. ADLs and personal hygiene are normal.   Appetite has not changed.  Weight is stable.   Denies suicidal or homicidal thoughts.  Patient denies increased energy with decreased need for sleep, increased talkativeness, racing thoughts, impulsivity or risky behaviors, increased spending, increased libido, grandiosity, increased irritability or anger, paranoia, or hallucinations.  Denies dizziness, syncope, seizures, numbness, tingling, tremor, tics, unsteady gait, slurred speech, confusion. Denies muscle or joint pain, stiffness, or dystonia.  Individual Medical History/ Review of Systems: Changes? :No    Past medications for mental health diagnoses include: Effexor, Latuda, Ambien, Sonata wasn't effective  Allergies: Patient has no known allergies.  Current Medications:  Current Outpatient Medications:    [START ON 02/27/2022] amphetamine-dextroamphetamine (ADDERALL) 30 MG tablet, 0.5 pills morning, noon, early afternoon, and early evening, Disp: 60 tablet, Rfl: 0   [START ON 03/29/2022] amphetamine-dextroamphetamine (ADDERALL) 30 MG tablet, 0.5 pills q am,1 at noon, 1 late afternoon,  1 in early evening., Disp: 60 tablet, Rfl: 0   [START ON 01/29/2022]  amphetamine-dextroamphetamine (ADDERALL) 30 MG tablet, Take 0.5 tablets by mouth in the morning, at noon, in the evening, and at bedtime., Disp: 60 tablet, Rfl: 0 Medication Side Effects: none  Family Medical/ Social History: Changes? 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  General Appearance: Casual and Well Groomed  Eye Contact:  Good  Speech:  Clear and Coherent and Normal Rate  Volume:  Normal  Mood:  Euthymic  Affect:  Congruent  Thought Process:  Goal Directed and Descriptions of Associations: Circumstantial  Orientation:  Full (Time, Place, and Person)  Thought Content: Logical   Suicidal Thoughts:  No  Homicidal Thoughts:  No  Memory:  WNL  Judgement:  Good  Insight:  Good  Psychomotor Activity:  Normal  Concentration:  Concentration: Good and Attention Span: Fair  Recall:  Good  Fund of Knowledge: Good  Language: Good  Assets:  Desire for Improvement  ADL's:  Intact  Cognition: WNL  Prognosis:  Good   DIAGNOSES:    ICD-10-CM   1. Attention deficit hyperactivity disorder (ADHD), unspecified ADHD type  F90.9     2. Generalized anxiety disorder  F41.1     3. Major depression, recurrent, full remission (HCC)  F33.42       Receiving Psychotherapy: No   RECOMMENDATIONS:  PDMP was reviewed. Adderall filled 12/30/2021. I provided 20 minutes of face to face time during this encounter, including time spent before and after the visit in records review, medical decision making, counseling pertinent to today's visit, and charting.   She is doing well on the current Adderall  dose so no change will be made.  Continue Adderall 30 mg, 1/2 pill 4 times daily or 1 p.o. every morning and 1/2 pill around lunch and 1/2 pill at supper as needed. Return in 6 months.   Donnal Moat, PA-C

## 2022-04-08 ENCOUNTER — Other Ambulatory Visit: Payer: Self-pay

## 2022-04-08 ENCOUNTER — Telehealth: Payer: Self-pay | Admitting: Physician Assistant

## 2022-04-08 MED ORDER — AMPHETAMINE-DEXTROAMPHETAMINE 30 MG PO TABS: 15.0000 mg | ORAL_TABLET | Freq: Four times a day (QID) | ORAL | 0 refills | Status: DC

## 2022-04-08 NOTE — Telephone Encounter (Signed)
Chelsea Byrd requesting the office to cancel the Adderall Rx that was [facility] [address]. She is having difficulty communicating with that pharmacy. She is requesting the Rx sent the [facility] in [place].   Contact patient to confirm submission # [phone] Last appointment 01/25/22 with no follow up scheduled

## 2022-04-08 NOTE — Telephone Encounter (Signed)
Pended.

## 2022-05-09 ENCOUNTER — Telehealth: Payer: Self-pay | Admitting: Physician Assistant

## 2022-05-09 ENCOUNTER — Other Ambulatory Visit: Payer: Self-pay

## 2022-05-09 MED ORDER — AMPHETAMINE-DEXTROAMPHETAMINE 30 MG PO TABS: ORAL_TABLET | ORAL | 0 refills | Status: DC

## 2022-05-09 NOTE — Telephone Encounter (Signed)
Pended.

## 2022-05-09 NOTE — Telephone Encounter (Signed)
PT lvm that she needs a refill on her adderall 30 mg.[facility] [place]

## 2022-06-06 ENCOUNTER — Other Ambulatory Visit: Payer: Self-pay | Admitting: Physician Assistant

## 2022-06-06 ENCOUNTER — Telehealth: Payer: Self-pay | Admitting: Physician Assistant

## 2022-06-06 MED ORDER — AMPHETAMINE-DEXTROAMPHETAMINE 30 MG PO TABS: ORAL_TABLET | ORAL | 0 refills | Status: DC

## 2022-06-06 NOTE — Telephone Encounter (Signed)
Pt called at 10:20a to request refill of Adderall to  [id number] [place], [place] [address], [place] [address] Phone: [phone]  Fax: [phone]   No upcoming appt scheduled.

## 2022-06-06 NOTE — Telephone Encounter (Signed)
Prescription sent

## 2022-08-04 ENCOUNTER — Telehealth: Payer: Self-pay | Admitting: Physician Assistant

## 2022-08-04 ENCOUNTER — Other Ambulatory Visit: Payer: Self-pay | Admitting: Physician Assistant

## 2022-08-04 MED ORDER — AMPHETAMINE-DEXTROAMPHETAMINE 30 MG PO TABS: ORAL_TABLET | ORAL | 0 refills | Status: DC

## 2022-08-04 NOTE — Telephone Encounter (Signed)
Please call and let her know I can't send a new Rx until she makes appt. She's due now. Thanks.

## 2022-08-04 NOTE — Telephone Encounter (Signed)
Pt scheduled appt for 5/23

## 2022-08-04 NOTE — Telephone Encounter (Signed)
Prescription sent

## 2022-08-04 NOTE — Telephone Encounter (Signed)
Pt LVM @ 1:01p.  She would like refill of Adderall to   [id number] [place], [place] - [address] AT [facility] [address], [place] [address] Phone: [phone]  Fax: [phone]   NO upcoming appt scheduled.

## 2022-08-11 ENCOUNTER — Encounter: Payer: Self-pay | Admitting: Physician Assistant

## 2022-08-11 ENCOUNTER — Ambulatory Visit: Payer: No Typology Code available for payment source | Admitting: Physician Assistant

## 2022-08-11 DIAGNOSIS — R454 Irritability and anger: Secondary | ICD-10-CM | POA: Diagnosis not present

## 2022-08-11 DIAGNOSIS — F3342 Major depressive disorder, recurrent, in full remission: Secondary | ICD-10-CM | POA: Diagnosis not present

## 2022-08-11 DIAGNOSIS — F909 Attention-deficit hyperactivity disorder, unspecified type: Secondary | ICD-10-CM | POA: Diagnosis not present

## 2022-08-11 MED ORDER — AMPHETAMINE-DEXTROAMPHETAMINE 30 MG PO TABS: 15.0000 mg | ORAL_TABLET | Freq: Four times a day (QID) | ORAL | 0 refills | Status: DC

## 2022-08-11 NOTE — Progress Notes (Signed)
[facility]  Patient ID: Chelsea Byrd,  MRN: [id number]  PCP: Patient, No Pcp Per  Date of Evaluation: 08/11/2022 Time spent:20 minutes  Chief Complaint:   Chief Complaint   ADHD; Follow-up    HISTORY/CURRENT STATUS: For routine med check.  Adderall is still working well.  States that attention is good without easy distractibility.  Able to focus on things and finish tasks to completion.   Under some stress b/c will be moving to [place] soon. Her lease won't be up for a few months though. Work is going ok. Gets irritable easily but thinks anybody would get irritable about the things she does. Patient is able to enjoy things.  Energy and motivation are good.  No extreme sadness, tearfulness, or feelings of hopelessness.  Sleeps well most of the time. ADLs and personal hygiene are normal.  Appetite has not changed.  Weight is stable. No PA.  Gets overwhelmed with things sometimes but thinks it's nl life.  Denies suicidal or homicidal thoughts.  Patient denies increased energy with decreased need for sleep, increased talkativeness, racing thoughts, impulsivity or risky behaviors, increased spending, increased libido, grandiosity, paranoia, or hallucinations.  Denies dizziness, syncope, seizures, numbness, tingling, tremor, tics, unsteady gait, slurred speech, confusion. Denies muscle or joint pain, stiffness, or dystonia.  Individual Medical History/ Review of Systems: Changes? :No    Past medications for mental health diagnoses include: Effexor, Latuda, Ambien, Sonata wasn't effective  Allergies: Patient has no known allergies.  Current Medications:  Current Outpatient Medications:    [START ON 09/03/2022] amphetamine-dextroamphetamine (ADDERALL) 30 MG tablet, Take 0.5 tablets by mouth 4 (four) times daily., Disp: 60 tablet, Rfl: 0   [START ON 10/02/2022] amphetamine-dextroamphetamine (ADDERALL) 30 MG tablet, Take 0.5 tablets by mouth 4 (four) times daily., Disp: 60  tablet, Rfl: 0   [START ON 11/01/2022] amphetamine-dextroamphetamine (ADDERALL) 30 MG tablet, Take 0.5 tablets by mouth 4 (four) times daily., Disp: 60 tablet, Rfl: 0 Medication Side Effects: none  Family Medical/ Social History: Changes? Is moving to [place]  MENTAL HEALTH EXAM:  Last menstrual period 08/03/2011.There is no height or weight on file to calculate BMI.  General Appearance: Casual and Well Groomed  Eye Contact:  Good  Speech:  Clear and Coherent and Normal Rate  Volume:  Normal  Mood:  Euthymic  Affect:  Congruent  Thought Process:  Goal Directed and Descriptions of Associations: Circumstantial  Orientation:  Full (Time, Place, and Person)  Thought Content: Logical   Suicidal Thoughts:  No  Homicidal Thoughts:  No  Memory:  WNL  Judgement:  Good  Insight:  Good  Psychomotor Activity:  Normal  Concentration:  Concentration: Good and Attention Span: Good  Recall:  Good  Fund of Knowledge: Good  Language: Good  Assets:  Desire for Improvement  ADL's:  Intact  Cognition: WNL  Prognosis:  Good   DIAGNOSES:    ICD-10-CM   1. Attention deficit hyperactivity disorder (ADHD), unspecified ADHD type  F90.9     2. Major depression, recurrent, full remission (HCC)  F33.42     3. Irritability  R45.4       Receiving Psychotherapy: No   RECOMMENDATIONS:  PDMP was reviewed. Adderall filled 12/30/2021. I provided 20 minutes of face to face time during this encounter, including time spent before and after the visit in records review, medical decision making, counseling pertinent to today's visit, and charting.   She is doing well on the current Adderall dose so no change will be made. If  irritability worsens or she starts having other sx like recurrent depression or anxiety, let me know.   Continue Adderall 30 mg, 1/2 pill 4 times daily. Return in 6 months.  Melony Overly, PA-C

## 2022-12-01 ENCOUNTER — Telehealth: Payer: Self-pay | Admitting: Physician Assistant

## 2022-12-01 ENCOUNTER — Other Ambulatory Visit: Payer: Self-pay

## 2022-12-01 NOTE — Telephone Encounter (Signed)
Chelsea Byrd called at 3:30 to request refill of her Adderall.  Made appt for 11/5.  [id number] [place], [place] - [address] AT [facility]

## 2022-12-01 NOTE — Telephone Encounter (Signed)
Pended.

## 2022-12-02 MED ORDER — AMPHETAMINE-DEXTROAMPHETAMINE 30 MG PO TABS: 15.0000 mg | ORAL_TABLET | Freq: Four times a day (QID) | ORAL | 0 refills | Status: DC

## 2022-12-28 ENCOUNTER — Telehealth: Payer: Self-pay | Admitting: Physician Assistant

## 2022-12-28 NOTE — Telephone Encounter (Signed)
RF DUE 12/30/22

## 2022-12-28 NOTE — Telephone Encounter (Signed)
Appt 01-24-23. Requesting refill on Adderall called to:  [id number] [place], [place] - [address] AT [facility]   Phone: [phone]  Fax: [phone]    Per patient it is in stock at this location.

## 2022-12-29 ENCOUNTER — Other Ambulatory Visit: Payer: Self-pay

## 2022-12-29 NOTE — Telephone Encounter (Signed)
LF 9/13, due 10/12

## 2022-12-29 NOTE — Telephone Encounter (Signed)
Pended.

## 2022-12-30 MED ORDER — AMPHETAMINE-DEXTROAMPHETAMINE 30 MG PO TABS: 15.0000 mg | ORAL_TABLET | Freq: Four times a day (QID) | ORAL | 0 refills | Status: DC

## 2023-01-24 ENCOUNTER — Encounter: Payer: Self-pay | Admitting: Physician Assistant

## 2023-01-24 ENCOUNTER — Ambulatory Visit: Payer: No Typology Code available for payment source | Admitting: Physician Assistant

## 2023-01-24 DIAGNOSIS — F331 Major depressive disorder, recurrent, moderate: Secondary | ICD-10-CM

## 2023-01-24 DIAGNOSIS — F909 Attention-deficit hyperactivity disorder, unspecified type: Secondary | ICD-10-CM | POA: Diagnosis not present

## 2023-01-24 DIAGNOSIS — F411 Generalized anxiety disorder: Secondary | ICD-10-CM | POA: Diagnosis not present

## 2023-01-24 MED ORDER — AMPHETAMINE-DEXTROAMPHETAMINE 30 MG PO TABS: 15.0000 mg | ORAL_TABLET | Freq: Four times a day (QID) | ORAL | 0 refills | Status: DC

## 2023-01-24 MED ORDER — BUSPIRONE HCL 15 MG PO TABS: ORAL_TABLET | ORAL | 1 refills | Status: DC

## 2023-01-24 NOTE — Progress Notes (Signed)
[facility]  Patient ID: Chelsea Byrd,  MRN: [id number]  PCP: Patient, No Pcp Per  Date of Evaluation: 01/24/2023 Time spent:25 minutes  Chief Complaint:   Chief Complaint   ADHD; Follow-up; Stress    HISTORY/CURRENT STATUS: For routine med check.  Life is overwhelming right now. Work is stressful. The time change is hard, she always has depression this time of year.  Lasts until Feb usually.  She has taken different antidepressants in the past and states they made her feel numb.  Does not want to take 1 if at all possible.  She is able to enjoy things.  Energy and motivation are good.  Appetite is normal and weight is stable.  She sleeps well.  ADLs and personal hygiene are normal.  No suicidal or homicidal thoughts.  States she is very anxious, feels on edge all the time.  Not having panic attacks.  Feels like something bad is going to happen most of the time.  Patient denies increased energy with decreased need for sleep, increased talkativeness, racing thoughts, impulsivity or risky behaviors, increased spending, increased libido, grandiosity, increased irritability or anger, paranoia, or hallucinations.  Denies dizziness, syncope, seizures, numbness, tingling, tremor, tics, unsteady gait, slurred speech, confusion. Denies muscle or joint pain, stiffness, or dystonia.  Individual Medical History/ Review of Systems: Changes? :No    Past medications for mental health diagnoses include: Effexor, Cymbalta, Prozac, Luvox, many others, Klonopin, Ativan, Latuda, Ambien, Sonata wasn't effective  Allergies: Patient has no known allergies.  Current Medications:  Current Outpatient Medications:    busPIRone (BUSPAR) 15 MG tablet, 1/3 tablet twice daily for 1 week, then increase to 2/3 tablet twice daily for 1 week, then increase to 1 tablet twice daily for anxiety., Disp: 60 tablet, Rfl: 1   amphetamine-dextroamphetamine (ADDERALL) 30 MG tablet, Take 0.5 tablets by mouth  4 (four) times daily., Disp: 60 tablet, Rfl: 0   [START ON 02/22/2023] amphetamine-dextroamphetamine (ADDERALL) 30 MG tablet, Take 0.5 tablets by mouth 4 (four) times daily., Disp: 60 tablet, Rfl: 0   [START ON 03/24/2023] amphetamine-dextroamphetamine (ADDERALL) 30 MG tablet, Take 0.5 tablets by mouth 4 (four) times daily., Disp: 60 tablet, Rfl: 0 Medication Side Effects: none  Family Medical/ Social History: Changes?  no  MENTAL HEALTH EXAM:  Last menstrual period 08/03/2011.There is no height or weight on file to calculate BMI.  General Appearance: Casual and Well Groomed  Eye Contact:  Good  Speech:  Clear and Coherent and Normal Rate  Volume:  Normal  Mood:  Anxious  Affect:  Congruent  Thought Process:  Goal Directed and Descriptions of Associations: Circumstantial  Orientation:  Full (Time, Place, and Person)  Thought Content: Logical   Suicidal Thoughts:  No  Homicidal Thoughts:  No  Memory:  WNL  Judgement:  Good  Insight:  Good  Psychomotor Activity:  Normal  Concentration:  Concentration: Good and Attention Span: Good  Recall:  Good  Fund of Knowledge: Good  Language: Good  Assets:  Desire for Improvement  ADL's:  Intact  Cognition: WNL  Prognosis:  Good   DIAGNOSES:    ICD-10-CM   1. Generalized anxiety disorder  F41.1     2. Attention deficit hyperactivity disorder (ADHD), unspecified ADHD type  F90.9     3. Major depressive disorder, recurrent episode, moderate (HCC)  F33.1      Receiving Psychotherapy: No   RECOMMENDATIONS:  PDMP was unable to be reviewed.  I provided 25 minutes of face to face time  during this encounter, including time spent before and after the visit in records review, medical decision making, counseling pertinent to today's visit, and charting.   Discussed anxiety.  She prefers not to add an antidepressant on.  Does not want it to change her personality.  I recommend BuSpar.  We discussed benefits, risk and side effects and she would  like to try it.  We also talked about a mood therapy lamp.  She will look into it. She was given information about high balance account.  Continue Adderall 30 mg, 1/2 pill 4 times daily. Start BuSpar 15 mg 1/3 tablet twice daily for 1 week, then increase to 2/3 tablet twice daily for 1 week, then increase to 1 tablet twice daily for anxiety. Return in 6-8 weeks.  Melony Overly, PA-C

## 2023-02-14 ENCOUNTER — Other Ambulatory Visit: Payer: Self-pay | Admitting: Physician Assistant

## 2023-02-15 NOTE — Telephone Encounter (Signed)
Lvm for pt to call and schedule

## 2023-02-15 NOTE — Telephone Encounter (Signed)
Please schedule pt an appt. Lv 11-5 due back in 6 weeks.

## 2023-04-26 ENCOUNTER — Telehealth: Payer: Self-pay | Admitting: Physician Assistant

## 2023-04-26 ENCOUNTER — Other Ambulatory Visit: Payer: Self-pay

## 2023-04-26 MED ORDER — AMPHETAMINE-DEXTROAMPHETAMINE 30 MG PO TABS: 15.0000 mg | ORAL_TABLET | Freq: Four times a day (QID) | ORAL | 0 refills | Status: DC

## 2023-04-26 NOTE — Telephone Encounter (Signed)
 Pended adderall 30 mg to [facility].

## 2023-04-26 NOTE — Telephone Encounter (Signed)
 Patient needs refill for Adderall 30mg . Ph: [phone] Appt 3/[facility] [address] [place], [place]

## 2023-04-26 NOTE — Telephone Encounter (Signed)
Please schedule pt an appl lv 11/6 due back in 6 weeks.

## 2023-04-26 NOTE — Telephone Encounter (Signed)
 Pt lvm that she needs a refill on her adderall 30 mg. [facility] [place]

## 2023-05-23 ENCOUNTER — Telehealth: Payer: Self-pay | Admitting: Physician Assistant

## 2023-05-23 NOTE — Telephone Encounter (Signed)
 LF 2/5, due 3/6

## 2023-05-23 NOTE — Telephone Encounter (Signed)
 Pt lvm that she needs a refill on her adderall 30 mg. [facility] [place]. Next appt 06/19/23

## 2023-05-24 NOTE — Telephone Encounter (Signed)
 Pt called at 10:07p.  She is at the pharmacy and thought the script was ready today.  I mistakenly told her it was due today, 3/5, but misread it and does say 3/6.  Pls call her back. SORRY!

## 2023-05-24 NOTE — Telephone Encounter (Signed)
 Called patient and told her that Rx would not be sent in until 3/6.

## 2023-05-25 ENCOUNTER — Other Ambulatory Visit: Payer: Self-pay

## 2023-05-25 MED ORDER — AMPHETAMINE-DEXTROAMPHETAMINE 30 MG PO TABS: 15.0000 mg | ORAL_TABLET | Freq: Four times a day (QID) | ORAL | 0 refills | Status: DC

## 2023-06-19 ENCOUNTER — Ambulatory Visit (INDEPENDENT_AMBULATORY_CARE_PROVIDER_SITE_OTHER): Payer: No Typology Code available for payment source | Admitting: Physician Assistant

## 2023-06-19 ENCOUNTER — Encounter: Payer: Self-pay | Admitting: Physician Assistant

## 2023-06-19 DIAGNOSIS — F909 Attention-deficit hyperactivity disorder, unspecified type: Secondary | ICD-10-CM | POA: Diagnosis not present

## 2023-06-19 DIAGNOSIS — F411 Generalized anxiety disorder: Secondary | ICD-10-CM | POA: Diagnosis not present

## 2023-06-19 MED ORDER — AMPHETAMINE-DEXTROAMPHETAMINE 30 MG PO TABS: 15.0000 mg | ORAL_TABLET | Freq: Four times a day (QID) | ORAL | 0 refills | Status: DC

## 2023-06-19 MED ORDER — BUSPIRONE HCL 30 MG PO TABS: 30.0000 mg | ORAL_TABLET | Freq: Every evening | ORAL | 1 refills | Status: AC

## 2023-06-19 NOTE — Progress Notes (Signed)
 [facility]  Patient ID: Chelsea Byrd,  MRN: [id number]  PCP: Patient, No Pcp Per  Date of Evaluation: 06/19/2023 Time spent:20 minutes  Chief Complaint:   Chief Complaint   Anxiety; ADHD    HISTORY/CURRENT STATUS: For routine med check.  Unable to tolerate Buspar during the day d/t drowsiness. Now taking 30 mg at bedtime and that is working well. No PA.   Patient is able to enjoy things.  Energy and motivation are good.  Work is going well, stressful as always.  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  No extreme sadness, tearfulness, or feelings of hopelessness.  Sleeps well most of the time. ADLs and personal hygiene are normal.   Appetite has not changed.  Weight is stable.   Denies suicidal or homicidal thoughts.  Patient denies increased energy with decreased need for sleep, increased talkativeness, racing thoughts, impulsivity or risky behaviors, increased spending, increased libido, grandiosity, increased irritability or anger, paranoia, or hallucinations.  Denies dizziness, syncope, seizures, numbness, tingling, tremor, tics, unsteady gait, slurred speech, confusion. Denies muscle or joint pain, stiffness, or dystonia.  Individual Medical History/ Review of Systems: Changes? :No    Past medications for mental health diagnoses include: Effexor, Cymbalta, Prozac, Luvox, many others, Klonopin, Ativan, Latuda, Ambien, Sonata wasn't effective  Allergies: Patient has no known allergies.  Current Medications:  Current Outpatient Medications:    busPIRone (BUSPAR) 30 MG tablet, Take 1 tablet (30 mg total) by mouth at bedtime., Disp: 90 tablet, Rfl: 1   cetirizine (ZYRTEC) 10 MG chewable tablet, Chew 10 mg by mouth daily., Disp: , Rfl:    [START ON 06/23/2023] amphetamine-dextroamphetamine (ADDERALL) 30 MG tablet, Take 0.5 tablets by mouth 4 (four) times daily., Disp: 60 tablet, Rfl: 0   [START ON 07/22/2023] amphetamine-dextroamphetamine (ADDERALL) 30 MG tablet, Take 0.5  tablets by mouth 4 (four) times daily., Disp: 60 tablet, Rfl: 0   [START ON 08/21/2023] amphetamine-dextroamphetamine (ADDERALL) 30 MG tablet, Take 0.5 tablets by mouth 4 (four) times daily., Disp: 60 tablet, Rfl: 0 Medication Side Effects: none  Family Medical/ Social History: Changes?  no  MENTAL HEALTH EXAM:  Last menstrual period 08/03/2011.There is no height or weight on file to calculate BMI.  General Appearance: Casual and Well Groomed  Eye Contact:  Good  Speech:  Clear and Coherent and Normal Rate  Volume:  Normal  Mood:  Euthymic  Affect:  Congruent  Thought Process:  Goal Directed and Descriptions of Associations: Circumstantial  Orientation:  Full (Time, Place, and Person)  Thought Content: Logical   Suicidal Thoughts:  No  Homicidal Thoughts:  No  Memory:  WNL  Judgement:  Good  Insight:  Good  Psychomotor Activity:  Normal  Concentration:  Concentration: Good and Attention Span: Good  Recall:  Good  Fund of Knowledge: Good  Language: Good  Assets:  Communication Skills Desire for Improvement Financial Resources/Insurance Housing Transportation Vocational/Educational  ADL's:  Intact  Cognition: WNL  Prognosis:  Good   DIAGNOSES:    ICD-10-CM   1. Generalized anxiety disorder  F41.1     2. Attention deficit hyperactivity disorder (ADHD), unspecified ADHD type  F90.9      Receiving Psychotherapy: No   RECOMMENDATIONS:  PDMP was unable to be reviewed.  I provided 20 minutes of face to face time during this encounter, including time spent before and after the visit in records review, medical decision making, counseling pertinent to today's visit, and charting.   She is doing well overall so no changes  will be made.  Continue Adderall 30 mg, 1/2 pill 4 times daily. Continue Buspar 30 mg, 1 at bedtime. Return in 6 months.  Melony Overly, PA-C

## 2023-10-31 DIAGNOSIS — I4510 Unspecified right bundle-branch block: Secondary | ICD-10-CM | POA: Diagnosis not present

## 2023-11-06 ENCOUNTER — Telehealth: Payer: Self-pay | Admitting: Physician Assistant

## 2023-11-06 ENCOUNTER — Other Ambulatory Visit: Payer: Self-pay

## 2023-11-06 DIAGNOSIS — F909 Attention-deficit hyperactivity disorder, unspecified type: Secondary | ICD-10-CM

## 2023-11-06 MED ORDER — AMPHETAMINE-DEXTROAMPHETAMINE 30 MG PO TABS: 15.0000 mg | ORAL_TABLET | Freq: Four times a day (QID) | ORAL | 0 refills | Status: DC

## 2023-11-06 NOTE — Telephone Encounter (Signed)
 Patient called in for refill on Adderall 0.5mg . Ph: [phone] Appt 9/22. Pharmacy [facility] [address] [place]

## 2023-11-06 NOTE — Telephone Encounter (Signed)
 Pt lvm requesting Rx Adderall. [facility].   Follow up due 9/30. Called pt lvm need to schedule follow up apt.

## 2023-11-06 NOTE — Telephone Encounter (Signed)
 Pended

## 2023-11-08 ENCOUNTER — Encounter: Payer: Self-pay | Admitting: Nurse Practitioner

## 2023-12-05 ENCOUNTER — Telehealth: Payer: Self-pay | Admitting: Physician Assistant

## 2023-12-05 NOTE — Telephone Encounter (Signed)
 Next visit is 10/14. Requesting refill for Adderall 30 mg. [facility]acy is:  [facility], [address] [place], [place] [address]. Phone number is [phone].

## 2023-12-05 NOTE — Telephone Encounter (Signed)
 Pt has a RF due today at the pharmacy. Pt notified.

## 2023-12-11 ENCOUNTER — Ambulatory Visit (INDEPENDENT_AMBULATORY_CARE_PROVIDER_SITE_OTHER): Payer: Self-pay | Admitting: Physician Assistant

## 2023-12-11 DIAGNOSIS — Z91199 Patient's noncompliance with other medical treatment and regimen due to unspecified reason: Secondary | ICD-10-CM

## 2023-12-12 NOTE — Progress Notes (Signed)
 No show

## 2024-01-02 ENCOUNTER — Ambulatory Visit (INDEPENDENT_AMBULATORY_CARE_PROVIDER_SITE_OTHER): Payer: Self-pay | Admitting: Physician Assistant

## 2024-01-02 ENCOUNTER — Telehealth: Payer: Self-pay | Admitting: Physician Assistant

## 2024-01-02 DIAGNOSIS — Z91199 Patient's noncompliance with other medical treatment and regimen due to unspecified reason: Secondary | ICD-10-CM

## 2024-01-02 NOTE — Progress Notes (Signed)
 No show

## 2024-01-02 NOTE — Telephone Encounter (Signed)
 Pt needs rf of Adderall  Missed appt today and r/s to 10/29   [facility] [address] [facility] [place]

## 2024-01-03 NOTE — Telephone Encounter (Signed)
 LF 9/18, due 10/17.

## 2024-01-05 ENCOUNTER — Other Ambulatory Visit: Payer: Self-pay

## 2024-01-05 DIAGNOSIS — F909 Attention-deficit hyperactivity disorder, unspecified type: Secondary | ICD-10-CM

## 2024-01-05 MED ORDER — AMPHETAMINE-DEXTROAMPHETAMINE 30 MG PO TABS: 15.0000 mg | ORAL_TABLET | Freq: Four times a day (QID) | ORAL | 0 refills | Status: AC

## 2024-01-05 NOTE — Telephone Encounter (Signed)
 Pended

## 2024-01-17 ENCOUNTER — Ambulatory Visit (INDEPENDENT_AMBULATORY_CARE_PROVIDER_SITE_OTHER): Admitting: Physician Assistant

## 2024-01-17 ENCOUNTER — Encounter: Payer: Self-pay | Admitting: Physician Assistant

## 2024-01-17 DIAGNOSIS — F418 Other specified anxiety disorders: Secondary | ICD-10-CM | POA: Diagnosis not present

## 2024-01-17 DIAGNOSIS — F909 Attention-deficit hyperactivity disorder, unspecified type: Secondary | ICD-10-CM | POA: Diagnosis not present

## 2024-01-17 MED ORDER — AMPHETAMINE-DEXTROAMPHETAMINE 30 MG PO TABS: 15.0000 mg | ORAL_TABLET | Freq: Four times a day (QID) | ORAL | 0 refills | Status: AC

## 2024-01-17 NOTE — Progress Notes (Unsigned)
 [facility]  Patient ID: Chelsea Byrd,  MRN: [id number]  PCP: Allen, Chad, NP  Date of Evaluation: 01/17/2024 Time spent:20 minutes  Chief Complaint:   Chief Complaint   ADHD; Anxiety; Follow-up   HISTORY/CURRENT STATUS: For routine med check.  The buspar  wasn't   Individual Medical History/ Review of Systems: Changes? :No    Past medications for mental health diagnoses include: Effexor, Cymbalta, Prozac, Luvox, many others, Klonopin, Ativan, Latuda, Ambien , Sonata  wasn't effective  Allergies: Patient has no known allergies.  Current Medications:  Current Outpatient Medications:    amphetamine -dextroamphetamine  (ADDERALL) 30 MG tablet, Take 0.5 tablets by mouth 4 (four) times daily., Disp: 60 tablet, Rfl: 0   esomeprazole (NEXIUM) 20 MG packet, Take 20 mg by mouth daily before breakfast., Disp: , Rfl:    [START ON 02/02/2024] amphetamine -dextroamphetamine  (ADDERALL) 30 MG tablet, Take 0.5 tablets by mouth 4 (four) times daily., Disp: 60 tablet, Rfl: 0   [START ON 03/01/2024] amphetamine -dextroamphetamine  (ADDERALL) 30 MG tablet, Take 0.5 tablets by mouth 4 (four) times daily., Disp: 60 tablet, Rfl: 0   [START ON 04/03/2024] amphetamine -dextroamphetamine  (ADDERALL) 30 MG tablet, Take 0.5 tablets by mouth 4 (four) times daily., Disp: 60 tablet, Rfl: 0   busPIRone  (BUSPAR ) 30 MG tablet, Take 1 tablet (30 mg total) by mouth at bedtime. (Patient not taking: Reported on 01/17/2024), Disp: 90 tablet, Rfl: 1   cetirizine (ZYRTEC) 10 MG chewable tablet, Chew 10 mg by mouth daily. (Patient not taking: Reported on 01/17/2024), Disp: , Rfl:  Medication Side Effects: none  Family Medical/ Social History: Changes?  no  MENTAL HEALTH EXAM:  Last menstrual period 08/03/2011.There is no height or weight on file to calculate BMI.  General Appearance: Casual and Well Groomed  Eye Contact:  Good  Speech:  Clear and Coherent and Normal Rate  Volume:  Normal  Mood:  Euthymic   Affect:  Congruent  Thought Process:  Goal Directed and Descriptions of Associations: Circumstantial  Orientation:  Full (Time, Place, and Person)  Thought Content: Logical   Suicidal Thoughts:  No  Homicidal Thoughts:  No  Memory:  WNL  Judgement:  Good  Insight:  Good  Psychomotor Activity:  Normal  Concentration:  Concentration: Good and Attention Span: Good  Recall:  Good  Fund of Knowledge: Good  Language: Good  Assets:  Communication Skills Desire for Improvement Financial Resources/Insurance Housing Transportation Vocational/Educational  ADL's:  Intact  Cognition: WNL  Prognosis:  Good   DIAGNOSES:    ICD-10-CM   1. Attention deficit hyperactivity disorder (ADHD), unspecified ADHD type  F90.9 amphetamine -dextroamphetamine  (ADDERALL) 30 MG tablet      Receiving Psychotherapy: No   RECOMMENDATIONS:  PDMP reviewed.  Adderall filled 01/05/2024. I provided approximately  20  minutes of face to face time during this encounter, including time spent before and after the visit in records review, medical decision making, counseling pertinent to today's visit, and charting.     Continue Adderall 30 mg, 1/2 pill 4 times daily. Return in 6 months.  Verneita Cooks, PA-C
